# Patient Record
Sex: Male | Born: 1950 | Hispanic: No | Marital: Married | Smoking: Never smoker
Health system: Southern US, Community
[De-identification: ages and names within clinical notes are randomized; demographics above are authoritative.]

## PROBLEM LIST (undated history)

## (undated) DIAGNOSIS — B029 Zoster without complications: Secondary | ICD-10-CM

## (undated) DIAGNOSIS — E785 Hyperlipidemia, unspecified: Secondary | ICD-10-CM

## (undated) DIAGNOSIS — I471 Supraventricular tachycardia, unspecified: Secondary | ICD-10-CM

## (undated) DIAGNOSIS — F419 Anxiety disorder, unspecified: Secondary | ICD-10-CM

## (undated) DIAGNOSIS — I2510 Atherosclerotic heart disease of native coronary artery without angina pectoris: Secondary | ICD-10-CM

## (undated) DIAGNOSIS — I517 Cardiomegaly: Secondary | ICD-10-CM

## (undated) HISTORY — DX: Cardiomegaly: I51.7

## (undated) HISTORY — PX: ROTATOR CUFF REPAIR: SHX139

## (undated) HISTORY — DX: Atherosclerotic heart disease of native coronary artery without angina pectoris: I25.10

## (undated) HISTORY — DX: Hyperlipidemia, unspecified: E78.5

## (undated) HISTORY — DX: Supraventricular tachycardia: I47.1

## (undated) HISTORY — DX: Supraventricular tachycardia, unspecified: I47.10

## (undated) HISTORY — PX: TENDON REPAIR: SHX5111

## (undated) HISTORY — DX: Anxiety disorder, unspecified: F41.9

## (undated) HISTORY — PX: NASAL SEPTUM SURGERY: SHX37

## (undated) HISTORY — DX: Zoster without complications: B02.9

---

## 2000-05-11 ENCOUNTER — Ambulatory Visit (HOSPITAL_BASED_OUTPATIENT_CLINIC_OR_DEPARTMENT_OTHER): Admission: RE | Admit: 2000-05-11 | Discharge: 2000-05-11 | Payer: Self-pay | Admitting: Orthopedic Surgery

## 2000-06-05 ENCOUNTER — Encounter: Admission: RE | Admit: 2000-06-05 | Discharge: 2000-08-08 | Payer: Self-pay | Admitting: Orthopedic Surgery

## 2002-05-07 ENCOUNTER — Encounter: Payer: Self-pay | Admitting: Allergy and Immunology

## 2002-05-07 ENCOUNTER — Encounter: Admission: RE | Admit: 2002-05-07 | Discharge: 2002-05-07 | Payer: Self-pay | Admitting: *Deleted

## 2003-11-15 DIAGNOSIS — B029 Zoster without complications: Secondary | ICD-10-CM

## 2003-11-15 HISTORY — DX: Zoster without complications: B02.9

## 2004-12-03 ENCOUNTER — Encounter: Admission: RE | Admit: 2004-12-03 | Discharge: 2004-12-03 | Payer: Self-pay | Admitting: Internal Medicine

## 2005-02-10 ENCOUNTER — Encounter: Admission: RE | Admit: 2005-02-10 | Discharge: 2005-02-10 | Payer: Self-pay | Admitting: Internal Medicine

## 2005-05-24 ENCOUNTER — Encounter (INDEPENDENT_AMBULATORY_CARE_PROVIDER_SITE_OTHER): Payer: Self-pay | Admitting: *Deleted

## 2006-03-31 ENCOUNTER — Ambulatory Visit (HOSPITAL_BASED_OUTPATIENT_CLINIC_OR_DEPARTMENT_OTHER): Admission: RE | Admit: 2006-03-31 | Discharge: 2006-03-31 | Payer: Self-pay | Admitting: Orthopedic Surgery

## 2008-08-10 ENCOUNTER — Inpatient Hospital Stay (HOSPITAL_COMMUNITY): Admission: EM | Admit: 2008-08-10 | Discharge: 2008-08-12 | Payer: Self-pay | Admitting: Internal Medicine

## 2008-08-10 ENCOUNTER — Ambulatory Visit: Payer: Self-pay | Admitting: Cardiology

## 2008-08-10 ENCOUNTER — Encounter: Payer: Self-pay | Admitting: Emergency Medicine

## 2008-08-11 ENCOUNTER — Encounter (INDEPENDENT_AMBULATORY_CARE_PROVIDER_SITE_OTHER): Payer: Self-pay | Admitting: Internal Medicine

## 2008-09-29 ENCOUNTER — Ambulatory Visit: Payer: Self-pay | Admitting: Internal Medicine

## 2009-01-09 ENCOUNTER — Ambulatory Visit: Payer: Self-pay | Admitting: Internal Medicine

## 2009-03-30 DIAGNOSIS — E785 Hyperlipidemia, unspecified: Secondary | ICD-10-CM

## 2009-03-30 DIAGNOSIS — F411 Generalized anxiety disorder: Secondary | ICD-10-CM | POA: Insufficient documentation

## 2009-03-30 DIAGNOSIS — I2510 Atherosclerotic heart disease of native coronary artery without angina pectoris: Secondary | ICD-10-CM | POA: Insufficient documentation

## 2009-03-30 DIAGNOSIS — I498 Other specified cardiac arrhythmias: Secondary | ICD-10-CM

## 2009-04-01 ENCOUNTER — Ambulatory Visit: Payer: Self-pay | Admitting: Internal Medicine

## 2009-04-03 ENCOUNTER — Ambulatory Visit: Payer: Self-pay | Admitting: Internal Medicine

## 2009-04-20 ENCOUNTER — Ambulatory Visit: Payer: Self-pay | Admitting: Diagnostic Radiology

## 2009-04-20 ENCOUNTER — Emergency Department (HOSPITAL_BASED_OUTPATIENT_CLINIC_OR_DEPARTMENT_OTHER): Admission: EM | Admit: 2009-04-20 | Discharge: 2009-04-20 | Payer: Self-pay | Admitting: Emergency Medicine

## 2009-04-27 ENCOUNTER — Ambulatory Visit: Payer: Self-pay | Admitting: Internal Medicine

## 2009-05-11 ENCOUNTER — Ambulatory Visit: Payer: Self-pay | Admitting: Internal Medicine

## 2009-07-06 ENCOUNTER — Encounter (INDEPENDENT_AMBULATORY_CARE_PROVIDER_SITE_OTHER): Payer: Self-pay | Admitting: *Deleted

## 2009-10-06 ENCOUNTER — Telehealth: Payer: Self-pay | Admitting: Internal Medicine

## 2009-12-01 ENCOUNTER — Ambulatory Visit: Payer: Self-pay | Admitting: Family Medicine
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
## 2010-03-08 ENCOUNTER — Ambulatory Visit: Payer: Self-pay | Admitting: Family Medicine

## 2010-03-08 LAB — CONVERTED CEMR LAB
ALT: 18 units/L (ref 0–53)
Alkaline Phosphatase: 49 units/L (ref 39–117)
Basophils Relative: 0.9 % (ref 0.0–3.0)
Bilirubin, Direct: 0.1 mg/dL (ref 0.0–0.3)
Calcium: 8.8 mg/dL (ref 8.4–10.5)
Chloride: 104 meq/L (ref 96–112)
Creatinine, Ser: 1.1 mg/dL (ref 0.4–1.5)
Eosinophils Relative: 3 % (ref 0.0–5.0)
HDL: 64 mg/dL (ref >39.00)
Ketones, urine, test strip: NEGATIVE
LDL Cholesterol: 90 mg/dL (ref 0–99)
Lymphocytes Relative: 29.1 % (ref 12.0–46.0)
Monocytes Relative: 5.6 % (ref 3.0–12.0)
Neutrophils Relative %: 61.4 % (ref 43.0–77.0)
Nitrite: NEGATIVE
RBC: 4.49 M/uL (ref 4.22–5.81)
Total CHOL/HDL Ratio: 3
Total Protein: 7 g/dL (ref 6.0–8.3)
Triglycerides: 54 mg/dL (ref 0.0–149.0)
Urobilinogen, UA: 0.2
WBC: 5 10*3/uL (ref 4.5–10.5)

## 2010-03-15 ENCOUNTER — Ambulatory Visit: Payer: Self-pay | Admitting: Family Medicine

## 2010-03-15 DIAGNOSIS — M542 Cervicalgia: Secondary | ICD-10-CM

## 2010-05-06 ENCOUNTER — Encounter
Admission: RE | Admit: 2010-05-06 | Discharge: 2010-05-06 | Payer: Self-pay | Admitting: Physical Medicine & Rehabilitation

## 2010-11-02 ENCOUNTER — Ambulatory Visit: Payer: Self-pay | Admitting: Family Medicine

## 2010-12-14 NOTE — Letter (Signed)
Summary: Date Range:01-31-05 to 05-24-05/[facility]  Date Range:01-31-05 to 05-24-05/[facility]   Imported By: Sherian Rein 04/30/2010 10:34:01  _____________________________________________________________________  External Attachment:    Type:   Image     Comment:   External Document

## 2010-12-14 NOTE — Assessment & Plan Note (Signed)
Summary: CPX // RS   Vital Signs:  Patient profile:   60 year old male Height:      65.5 inches Weight:      149 pounds BMI:     24.51 Temp:     97.7 degrees F oral BP sitting:   110 / 76  (left arm) Cuff size:   regular  Vitals Entered By: Kern Reap CMA Duncan Dull) (Mar 15, 2010 8:32 AM) CC: cpx   Primary Care Provider:  Valente David MD  CC:  cpx.  History of Present Illness: [name] is a 60 year old, married male, nonsmoker, who comes in today for evaluation of 4 problems.  He has a history of underlying coronary disease.  He takes Lipitor 20 mg daily asymptomatic.  He also takes a beta-blocker 25 mg daily.  He does take one aspirin tablet daily.  He does take a Valium 10 mg p.r.n. when he travels.  He continues to have cervical pain.  In 2005 he developed shingles and has developed a post-shingles neuralgia.  He's been worked up by his primary care care physician at the time Dr. Lenord Fellers, who subsequently referred him to Dr. love.  No medication.  Would help.  They even tried low-dose Vicodin however, he couldn't tolerate that because of nausea.he does Stepanian is constant, dull, a 6 on a scale of one to 10, and it causes significant sleep dysfunction.  He's never been to the pain clinic.  Will refer him for further evaluation.  He also states his height has decreased by 2 inches in the last two years.........Marland Kitchen will check bone density.  He gets routine eye care.  [id number]  months because of borderline pressures.  He gets routine dental care, colonoscopy, normal in GI, tetanus, 2009, seasonal flu 2010, hepatitis A, 2010.   Allergies: No Known Drug Allergies  Past History:  Past medical, surgical, family and social histories (including risk factors) reviewed, and no changes noted (except as noted below).  Past Medical History: Reviewed history from 03/30/2009 and no changes required. ANXIETY (ICD-300.00) CORONARY ARTERY DISEASE (ICD-414.00) HYPERLIPIDEMIA  (ICD-272.4) SUPRAVENTRICULAR TACHYCARDIA (ICD-427.89)  Past Surgical History: Reviewed history from 03/30/2009 and no changes required. Status post rotator cuff repair.  Status post nasal septoplasty.   Status post left arm tendon repair  Family History: Reviewed history from 03/30/2009 and no changes required.  His father had coronary artery disease in his 42s and   eventually died of a myocardial infarction at the age of 59.  He has two   brothers with heart problems, at least one of whom has coronary artery   disease and has had revascularization.  He also has a sister with heart   disease and thinks that she has bradycardia requiring a pacemaker.   Social History: Reviewed history from 03/30/2009 and no changes required.  The patient is married with no children.  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  He has never smoked, does not drink   alcohol and does not use drugs.  He exercises regularly without   limitation or chest pain.   Review of Systems      See HPI  Physical Exam  General:  Well-developed,well-nourished,in no acute distress; alert,appropriate and cooperative throughout examination Head:  Normocephalic and atraumatic without obvious abnormalities. No apparent alopecia or balding. Eyes:  No corneal or conjunctival inflammation noted. EOMI. Perrla. Funduscopic exam benign, without hemorrhages, exudates or papilledema. Vision grossly normal. Ears:  External ear exam shows no significant  lesions or deformities.  Otoscopic examination reveals clear canals, tympanic membranes are intact bilaterally without bulging, retraction, inflammation or discharge. Hearing is grossly normal bilaterally. Nose:  External nasal examination shows no deformity or inflammation. Nasal mucosa are pink and moist without lesions or exudates. Mouth:  Oral mucosa and oropharynx without lesions or exudates.  Teeth in good repair. Neck:  No deformities, masses, or tenderness noted. Chest  Wall:  No deformities, masses, tenderness or gynecomastia noted. Breasts:  No masses or gynecomastia noted Lungs:  Normal respiratory effort, chest expands symmetrically. Lungs are clear to auscultation, no crackles or wheezes. Heart:  Normal rate and regular rhythm. S1 and S2 normal without gallop, murmur, click, rub or other extra sounds. Abdomen:  Bowel sounds positive,abdomen soft and non-tender without masses, organomegaly or hernias noted. Rectal:  No external abnormalities noted. Normal sphincter tone. No rectal masses or tenderness. Genitalia:  Testes bilaterally descended without nodularity, tenderness or masses. No scrotal masses or lesions. No penis lesions or urethral discharge. Prostate:  Prostate gland firm and smooth, no enlargement, nodularity, tenderness, mass, asymmetry or induration. Msk:  No deformity or scoliosis noted of thoracic or lumbar spine.   Pulses:  R and L carotid,radial,femoral,dorsalis pedis and posterior tibial pulses are full and equal bilaterally Extremities:  No clubbing, cyanosis, edema, or deformity noted with normal full range of motion of all joints.   Neurologic:  No cranial nerve deficits noted. Station and gait are normal. Plantar reflexes are down-going bilaterally. DTRs are symmetrical throughout. Sensory, motor and coordinative functions appear intact. Skin:  Intact without suspicious lesions or rashes Cervical Nodes:  No lymphadenopathy noted Axillary Nodes:  No palpable lymphadenopathy Inguinal Nodes:  No significant adenopathy Psych:  Cognition and judgment appear intact. Alert and cooperative with normal attention span and concentration. No apparent delusions, illusions, hallucinations   Impression & Recommendations: Assessment Improved  His updated medication list for this problem includes:    Diazepam 10 Mg Tabs (Diazepam) .Marland Kitchen... Take one tab at bedtime as needed  Problem # 2:  HYPERLIPIDEMIA (ICD-272.4) Assessment: Improved  His updated  medication list for this problem includes:    Lipitor 20 Mg Tabs (Atorvastatin calcium) .Marland Kitchen... Take one tablet by mouth daily.  Orders: Prescription Created Electronically [phone]) EKG w/ Interpretation (93000)  Problem # 3:  Preventive Health Care (ICD-V70.0) Assessment: Unchanged  Problem # 4:  NECK PAIN, CHRONIC (ICD-723.1) Assessment: New  His updated medication list for this problem includes:    Aspirin Ec 325 Mg Tbec (Aspirin) .Marland Kitchen... Take one tablet by mouth daily  Orders: Pain Clinic Referral (Pain)  Complete Medication List: 1)  Lipitor 20 Mg Tabs (Atorvastatin calcium) .... Take one tablet by mouth daily. 2)  Metoprolol Succinate 25 Mg Xr24h-tab (Metoprolol succinate) .... Take one tablet by mouth daily 3)  Aspirin Ec 325 Mg Tbec (Aspirin) .... Take one tablet by mouth daily 4)  Diazepam 10 Mg Tabs (Diazepam) .... Take one tab at bedtime as needed  Other Orders: T-Bone Densitometry [phone])  Patient Instructions: 1)  we will set you up a consult in the pain clinic for further evaluation. 2)  Continue current medications follow-up in one year. 3)  We will also get to set up for a bone density.  Because of the change in your height 4)  It is important that you exercise regularly at least 20 minutes 5 times a week. If you develop chest pain, have severe difficulty breathing, or feel very tired , stop exercising immediately and seek medical attention.  5)  Schedule a colonoscopy/sigmoidoscopy to help detect colon cancer. 6)  Take an Aspirin every day. Prescriptions: DIAZEPAM 10 MG TABS (DIAZEPAM) take one tab at bedtime as needed  #50 x 1   Entered and Authorized by:   Roderick Pee MD   Signed by:   Roderick Pee MD on 03/15/2010   Method used:   Print then Give to Patient   RxID:   [id number] METOPROLOL SUCCINATE 25 MG XR24H-TAB (METOPROLOL SUCCINATE) Take one tablet by mouth daily  #100 x 3   Entered and Authorized by:   Roderick Pee MD   Signed by:   Roderick Pee MD on 03/15/2010   Method used:   Electronically to        [facility]  [phone]* (retail)       [address]       [place], [place]  [address]       Ph: [phone] or [phone]       Fax: [phone]   RxID:   [id number] LIPITOR 20 MG TABS (ATORVASTATIN CALCIUM) Take one tablet by mouth daily.  #100 x 3   Entered and Authorized by:   Roderick Pee MD   Signed by:   Roderick Pee MD on 03/15/2010   Method used:   Electronically to        [facility]  [phone]* (retail)       [address]       [place], [place]  [address]       Ph: [phone] or [phone]       Fax: [phone]   RxID:   [id number]    Immunization History:  Hepatitis A Immunization History:    Hepatitis A # 1:  historical (08/14/2009)

## 2010-12-14 NOTE — Assessment & Plan Note (Signed)
Summary: to be est/njr   Vital Signs:  Patient profile:   60 year old male Height:      67 inches Weight:      150 pounds Temp:     98.3 degrees F oral BP sitting:   120 / 84  (left arm) Cuff size:   regular  Vitals Entered By: Kern Reap CMA Duncan Dull) (December 01, 2009 1:55 PM)  Primary Care Provider:  Valente David MD   History of Present Illness: [name] is a delightful, 60 year old, married male, nonsmoker, who comes in today as a new patient to get established.  He states he was hospitalized in September of 09  for SVT.  cardiac evaluation at that time was negative, except for a 40% blockage, but he does not recall which artery was involved.  He was told by Dr. Johney Frame.  It was not significant.  He exercises without chest pain, shortness of breath, etc..  He is on metoprolol 25 mg daily and has no recurrence of the arrhythmia.  Last physical exam, April 2010.  Review of systems pertinent in this surgery.  Ligament in his left elbow, right shoulder, about of zoster back in 04, which left him with some post shingles neuralgia in his neck.  His family history is pertinent in that his father died at age 87 from myocardial infarction.  Two brothers one died of an MI.  Mother has had bypass surgery two sisters one has a pacemaker in one has hypertension.  Tetanus 2009 seasonal flu 2010  Allergies (verified): No Known Drug Allergies  Past History:  Past medical, surgical, family and social histories (including risk factors) reviewed, and no changes noted (except as noted below).  Past Medical History: Reviewed history from 03/30/2009 and no changes required. ANXIETY (ICD-300.00) CORONARY ARTERY DISEASE (ICD-414.00) HYPERLIPIDEMIA (ICD-272.4) SUPRAVENTRICULAR TACHYCARDIA (ICD-427.89)  Past Surgical History: Reviewed history from 03/30/2009 and no changes required. Status post rotator cuff repair.  Status post nasal septoplasty.   Status post left arm tendon  repair  Family History: Reviewed history from 03/30/2009 and no changes required.  His father had coronary artery disease in his 58s and   eventually died of a myocardial infarction at the age of 105.  He has two   brothers with heart problems, at least one of whom has coronary artery   disease and has had revascularization.  He also has a sister with heart   disease and thinks that she has bradycardia requiring a pacemaker.   Social History: Reviewed history from 03/30/2009 and no changes required.  The patient is married with no children.  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  He has never smoked, does not drink   alcohol and does not use drugs.  He exercises regularly without   limitation or chest pain.   Review of Systems      See HPI  Physical Exam  General:  Well-developed,well-nourished,in no acute distress; alert,appropriate and cooperative throughout examination   Impression & Recommendations:  Problem # 1:  HYPERLIPIDEMIA (ICD-272.4) Assessment Unchanged  His updated medication list for this problem includes:    Lipitor 20 Mg Tabs (Atorvastatin calcium) .Marland Kitchen... Take one tablet by mouth daily.  Orders: Cardio-Pulmonary Stress Test Referral (Cardio-Pulmon) Prescription Created Electronically [phone])  Problem # 2:  SUPRAVENTRICULAR TACHYCARDIA (ICD-427.89) Assessment: Improved  His updated medication list for this problem includes:    Metoprolol Succinate 25 Mg Xr24h-tab (Metoprolol succinate) .Marland Kitchen... Take one tablet by mouth daily  Aspirin Ec 325 Mg Tbec (Aspirin) .Marland Kitchen... Take one tablet by mouth daily  Orders: Cardio-Pulmonary Stress Test Referral (Cardio-Pulmon) Prescription Created Electronically [phone])  Problem # 3:  ANXIETY (ICD-300.00) Assessment: Unchanged  His updated medication list for this problem includes:    Diazepam 10 Mg Tabs (Diazepam) .Marland Kitchen... Take one tab at bedtime as needed  Orders: Prescription Created Electronically  [phone])  Problem # 4:  CORONARY ARTERY DISEASE (ICD-414.00) Assessment: Unchanged  His updated medication list for this problem includes:    Metoprolol Succinate 25 Mg Xr24h-tab (Metoprolol succinate) .Marland Kitchen... Take one tablet by mouth daily    Aspirin Ec 325 Mg Tbec (Aspirin) .Marland Kitchen... Take one tablet by mouth daily  Orders: Cardio-Pulmonary Stress Test Referral (Cardio-Pulmon) Prescription Created Electronically [id number])  Complete Medication List: 1)  Lipitor 20 Mg Tabs (Atorvastatin calcium) .... Take one tablet by mouth daily. 2)  Metoprolol Succinate 25 Mg Xr24h-tab (Metoprolol succinate) .... Take one tablet by mouth daily 3)  Aspirin Ec 325 Mg Tbec (Aspirin) .... Take one tablet by mouth daily 4)  Diazepam 10 Mg Tabs (Diazepam) .... Take one tab at bedtime as needed  Patient Instructions: 1)  continue your current medications. 2)  Excess of your personal and family history I would recommend a stress test yearly. 3)  Return in April 2011 for a complete physical exam. 4)  cbc, lipid panel, u/a ,tsh level, liver fcn panel,bmet, and psa prior to next visit (v70.0)  Prescriptions: METOPROLOL SUCCINATE 25 MG XR24H-TAB (METOPROLOL SUCCINATE) Take one tablet by mouth daily  #100 x 3   Entered and Authorized by:   Roderick Pee MD   Signed by:   Roderick Pee MD on 12/01/2009   Method used:   Electronically to        [facility]  [phone]* (retail)       [address]       [place], [place]  [address]       Ph: [phone] or [phone]       Fax: [phone]   RxID:   [id number] LIPITOR 20 MG TABS (ATORVASTATIN CALCIUM) Take one tablet by mouth daily.  #100 x 3   Entered and Authorized by:   Roderick Pee MD   Signed by:   Roderick Pee MD on 12/01/2009   Method used:   Electronically to        [facility]  [phone]* (retail)       [address]       [place], [place]  [address]       Ph: [phone]  or [phone]       Fax: [phone]   RxID:   [id number]    Immunization History:  Tetanus/Td Immunization History:    Tetanus/Td:  historical (11/15/2007)  Influenza Immunization History:    Influenza:  historical (08/14/2009)

## 2011-03-11 ENCOUNTER — Encounter: Payer: Self-pay | Admitting: Family Medicine

## 2011-03-14 ENCOUNTER — Encounter: Payer: Self-pay | Admitting: Family Medicine

## 2011-03-14 ENCOUNTER — Ambulatory Visit (INDEPENDENT_AMBULATORY_CARE_PROVIDER_SITE_OTHER): Payer: Managed Care, Other (non HMO) | Admitting: Family Medicine

## 2011-03-14 DIAGNOSIS — N400 Enlarged prostate without lower urinary tract symptoms: Secondary | ICD-10-CM | POA: Insufficient documentation

## 2011-03-14 DIAGNOSIS — I10 Essential (primary) hypertension: Secondary | ICD-10-CM

## 2011-03-14 DIAGNOSIS — Z2911 Encounter for prophylactic immunotherapy for respiratory syncytial virus (RSV): Secondary | ICD-10-CM

## 2011-03-14 DIAGNOSIS — R972 Elevated prostate specific antigen [PSA]: Secondary | ICD-10-CM

## 2011-03-14 DIAGNOSIS — F411 Generalized anxiety disorder: Secondary | ICD-10-CM

## 2011-03-14 DIAGNOSIS — F419 Anxiety disorder, unspecified: Secondary | ICD-10-CM

## 2011-03-14 DIAGNOSIS — Z23 Encounter for immunization: Secondary | ICD-10-CM

## 2011-03-14 DIAGNOSIS — E785 Hyperlipidemia, unspecified: Secondary | ICD-10-CM

## 2011-03-14 LAB — LIPID PANEL
Cholesterol: 138 mg/dL (ref 0–200)
LDL Cholesterol: 72 mg/dL (ref 0–99)
Triglycerides: 32 mg/dL (ref 0.0–149.0)

## 2011-03-14 LAB — CBC WITH DIFFERENTIAL/PLATELET
Eosinophils Relative: 1.7 % (ref 0.0–5.0)
HCT: 42.1 % (ref 39.0–52.0)
Hemoglobin: 14.4 g/dL (ref 13.0–17.0)
Lymphs Abs: 1.6 10*3/uL (ref 0.7–4.0)
MCV: 93.1 fl (ref 78.0–100.0)
Monocytes Absolute: 0.4 10*3/uL (ref 0.1–1.0)
Neutro Abs: 5 10*3/uL (ref 1.4–7.7)
Platelets: 153 10*3/uL (ref 150.0–400.0)
RDW: 14 % (ref 11.5–14.6)
WBC: 7.2 10*3/uL (ref 4.5–10.5)

## 2011-03-14 LAB — TSH: TSH: 1.23 u[IU]/mL (ref 0.35–5.50)

## 2011-03-14 LAB — BASIC METABOLIC PANEL
BUN: 30 mg/dL — ABNORMAL HIGH (ref 6–23)
Chloride: 103 mEq/L (ref 96–112)
Glucose, Bld: 81 mg/dL (ref 70–99)
Potassium: 4.7 mEq/L (ref 3.5–5.1)

## 2011-03-14 LAB — POCT URINALYSIS DIPSTICK
Blood, UA: NEGATIVE
Glucose, UA: NEGATIVE
Spec Grav, UA: 1.025

## 2011-03-14 LAB — HEPATIC FUNCTION PANEL
ALT: 23 U/L (ref 0–53)
Albumin: 4.2 g/dL (ref 3.5–5.2)
Total Bilirubin: 1 mg/dL (ref 0.3–1.2)
Total Protein: 6.7 g/dL (ref 6.0–8.3)

## 2011-03-14 LAB — PSA: PSA: 2.67 ng/mL (ref 0.10–4.00)

## 2011-03-14 MED ORDER — ATORVASTATIN CALCIUM 20 MG PO TABS: 20.0000 mg | ORAL_TABLET | Freq: Every day | ORAL | Status: DC

## 2011-03-14 MED ORDER — DIAZEPAM 10 MG PO TABS: 10.0000 mg | ORAL_TABLET | Freq: Every day | ORAL | Status: DC

## 2011-03-14 MED ORDER — METOPROLOL SUCCINATE ER 25 MG PO TB24: 25.0000 mg | ORAL_TABLET | Freq: Every day | ORAL | Status: DC

## 2011-03-14 NOTE — Patient Instructions (Signed)
I will call you and I gave her lab work back

## 2011-03-14 NOTE — Progress Notes (Deleted)
  Subjective:    Patient ID: [name], male    DOB: 21-Oct-1951, 60 y.o.   MRN: [id number]  HPI    Review of Systems     Objective:   Physical Exam        Assessment & Plan:

## 2011-03-14 NOTE — Progress Notes (Signed)
  Subjective:    Patient ID: [name], male    DOB: [birth year], 60 y.o.   MRN: [id number]  HPIAqeel Is a 60 year old male, who comes in today for evaluation of underlying hypertension, hyperlipidemia, and elevated PSA.  History of coronary disease, and a heart murmur.  His hypertension is treated with Cardizem 10 mg daily, beta-blocker, 25 daily, BP 120/80, pulse 70 and regular.  Hyperlipidemia.  She was Lipitor 20 mg nightly will check lipid panel today.  Cardiac wise.  He is asymptomatic.  He takes an aspirin tablet daily, and exercises on a regular basis.  His last cardiac evaluation was May 2010 by Dr. Johney Frame.  He is to go back for follow-up.  He also has a history of elevated PSA is followed by Dr. Vonzell Schlatter. In urology.  He gets routine eye care, dental care, colonoscopy, normal 2003, tetanus, 2009, shingles.  Vaccine today    Review of Systems  Constitutional: Negative.   HENT: Negative.   Eyes: Negative.   Respiratory: Negative.   Cardiovascular: Negative.   Gastrointestinal: Negative.   Genitourinary: Negative.   Musculoskeletal: Negative.   Skin: Negative.   Neurological: Negative.   Hematological: Negative.   Psychiatric/Behavioral: Negative.        Objective:   Physical Exam  Constitutional: He is oriented to person, place, and time. He appears well-developed and well-nourished.  HENT:  Head: Normocephalic and atraumatic.  Right Ear: External ear normal.  Left Ear: External ear normal.  Nose: Nose normal.  Mouth/Throat: Oropharynx is clear and moist.  Eyes: Conjunctivae and EOM are normal. Pupils are equal, round, and reactive to light.  Neck: Normal range of motion. Neck supple. No JVD present. No tracheal deviation present. No thyromegaly present.  Cardiovascular: Normal rate, regular rhythm and intact distal pulses.  Exam reveals no gallop and no friction rub.   Murmur heard.      Grade 2/6 blowing systolic murmur heard best at the apex  Pulmonary/Chest:  Effort normal and breath sounds normal. No stridor. No respiratory distress. He has no wheezes. He has no rales. He exhibits no tenderness.  Abdominal: Soft. Bowel sounds are normal. He exhibits no distension and no mass. There is no tenderness. There is no rebound and no guarding.  Genitourinary: Rectum normal, prostate normal and penis normal. Guaiac negative stool. No penile tenderness.  Musculoskeletal: Normal range of motion. He exhibits no edema and no tenderness.  Lymphadenopathy:    He has no cervical adenopathy.  Neurological: He is alert and oriented to person, place, and time. He has normal reflexes. No cranial nerve deficit. He exhibits normal muscle tone.  Skin: Skin is warm and dry. No rash noted. No erythema. No pallor.  Psychiatric: He has a normal mood and affect. His behavior is normal. Judgment and thought content normal.          Assessment & Plan:T.      the beta-blocker also covers for the SVHyperlipidemia.  Continue Lipitor 20 daily and aspirin.  Elevated PSA.  Recheck level.  Coronary disease.  Follow-up with Dr. Johney Frame

## 2011-03-16 NOTE — Progress Notes (Signed)
Left message on machine for patient

## 2011-03-28 ENCOUNTER — Encounter: Payer: Self-pay | Admitting: Internal Medicine

## 2011-03-29 NOTE — Discharge Summary (Signed)
Samuel, Lutz NO.:  [id number]   MEDICAL RECORD NO.:  [id number]          PATIENT TYPE:  INP   LOCATION:  2041                         FACILITY:  [facility]   PHYSICIAN:  Hillis Range, MD       DATE OF BIRTH:  1951/07/13   DATE OF ADMISSION:  08/10/2008  DATE OF DISCHARGE:  08/12/2008                               DISCHARGE SUMMARY   This patient has no known drug allergies.   The time of this dictation and exam greater than 35 minutes.   FINAL DIAGNOSES:  1. Admitted with palpitation/diaphoresis.      a.     Supraventricular tachycardia, on strips in the emergency       room.      b.     Possible atrial tachycardia which broke with IV Cardizem.      c.     Now, the patient is discharged on metoprolol 25 mg b.i.d.  2. Discharging day #1, status post left heart catheterization.  The      study shows angiographically normal coronary arteries except for a      40% proximal midpoint stenosis in the LAD.  Ejection fraction at      cath was 55%.  3. Elevated troponin I studies.  The zenith is 4.28, probable demand      ischemia.  4. Echocardiogram on August 11, 2008, left ventricular function is      60-65%.  No mitral regurgitation.  No tricuspid regurgitation.   PLAN:  Continue beta-blocker/aspirin/Plavix/Lipitor.   SECONDARY DIAGNOSES:  1. Dyslipidemia.  2. History of shoulder surgery.  No previous history of coronary      artery disease or cerebrovascular disease.   PROCEDURES DURING THIS ADMISSION:  1. Left heart catheterization on August 11, 2008.  The study shows      only one area of focal stenosis in the prox to mid  LAD, otherwise      coronaries are angiographically normal, ejection fraction at 55%.  2. A 2-D echocardiogram on August 11, 2008.  Ejection fraction of      60-65%.  No mitral regurgitation.  No tricuspid regurgitation.  No      left ventricular wall motion abnormalities.   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  He was exercising  and then after exercise he started with palpitation and diaphoresis.  The patient did not have nausea or chest pain with this.  He has never  had such an episode before in his life.   The patient exercises regularly.  He has never experienced any chest  pain or palpitation before August 10, 2008.  He came to the emergency  room and was found to be in a narrow complex tachycardia consistent with  SVT, probably an atrial tachycardia.  This rhythm broke after the  patient was given IV Cardizem.  He was admitted for further workup.  His  enzymes will be cycled and he will have Cardiology in consultation.   HOSPITAL COURSE:  The patient was admitted through the emergency room on  August 10, 2008, with palpitation and diaphoresis.  He was found to  have a rapid narrow complex tachycardia consistent possibly with atrial  tachycardia.  This rhythm resolved after IV Cardizem was administered.  The patient was then started on a beta-blocker, metoprolol 25 mg twice  daily.  He has had no recurrence during this hospitalization.  He did  have elevated troponin I studies with a zenith troponin of 4.28.  For  this reason, he went for a left heart catheterization on August 11, 2008.  The study as mentioned above showed angiographically normal  coronaries except for a 40% stenosis at the mid LAD.  Ejection fraction  is preserved at 55%.  Echocardiogram also parallels these results with  an ejection fraction of 60-65% and all valves working well.  The patient  was seen by Dr. Johney Frame in consultation for electrophysiology on  August 12, 2008.  He agreed with continuing aspirin, Plavix, and  metoprolol 25 mg b.i.d. and recommended the patient for a followup  office visit in 4-6 weeks.  The patient will be discharged on the  following medications:  1. A new medication, enteric-coated aspirin 325 mg daily.  2. New medication, Plavix 75 mg daily, a prescription has  been given.  3. New medication, metoprolol tartrate 25 mg twice daily, a      prescription has been given.  4. Lipitor 20 mg daily each night.  He has been taking this at home.   He follows up at [facility], [address]  [place], [place]; to see Dr. Johney Frame on Monday, September 15, 2008, at 2 o'clock.   LABORATORY STUDIES DURING THIS ADMISSION:  Troponin I studies at 1.74,  then 4.28, then 2.47.  Complete Blood Count:  White cells 10.4,  hemoglobin 14.4, hematocrit 42.9, and platelets of 163.  Lipid Panel:  Cholesterol 126, LDL cholesterol 72, HDL cholesterol 43, and  triglycerides 55.  Serum Electrolytes:  Sodium is 140, potassium 3.9,  chloride 106, carbonate 27, BUN is 15, creatinine 1.12, and glucose is  94.  Pro time this admission 14.1 and INR 1.1.  Alkaline phosphatase is  46, SGOT 56, and SGPT is 21.      [name], [name]      Hillis Range, MD  Electronically Signed    GM/MEDQ  D:  08/12/2008  T:  08/13/2008  Job:  [id number]

## 2011-03-29 NOTE — H&P (Signed)
[name], [name]                 ACCOUNT NO.:  [id number]   MEDICAL RECORD NO.:  [id number]          PATIENT TYPE:  INP   LOCATION:  2041                         FACILITY:  [facility]   PHYSICIAN:  Wilson Singer, M.D.DATE OF BIRTH:  August 23, 1951   DATE OF ADMISSION:  08/10/2008  DATE OF DISCHARGE:                              HISTORY & PHYSICAL   HISTORY:  This is a very pleasant 60 year old [place] man who after  exercise today started to notice palpitations which were rapid and  regular, and he had experienced left arm pain as well as sweating with  this.  There was no nausea or dyspnea or chest pain with this.  He has  never had such an episode of his life before.  He exercises regularly  and has never experienced any chest pain or any palpitations before with  exertion except for today.  He came to the emergency room and was found  to have a, by description of the ER physician, a supraventricular  tachycardia which was treated with intravenous Cardizem drip and has  promptly gone back into a sinus rhythm with occasional episodes of short  runs of SVT and possibly V-tach, but this is not entirely clear.  He  continued on a Cardizem drip, and we are asked to further evaluate him  now.   PAST MEDICAL HISTORY:  Hyperlipidemia on statin.  Shoulder surgery in  the past.  No other serious illnesses or operations.  No history of  coronary artery disease and no history of cerebrovascular disease.   MEDICATIONS:  Lipitor 20 mg daily.   ALLERGIES:  None.   SOCIAL HISTORY:  He has been married for about 30 years.  He does not  smoke and does not drink alcohol.  He is a Chiropodist working in Conservation officer, historic buildings.   FAMILY HISTORY:  He appears to have a strong family history of coronary  artery disease, although on close questioning it is not clear whether  this is early coronary artery disease but certainly his father,  grandfather, and brothers and also sisters apparently do have  heart  disease.   PHYSICAL EXAMINATION:  GENERAL:  The patient is afebrile and appears to  be hemodynamically stable.  VITAL SIGNS:  Temperature 97.8, blood pressure 125/80, pulse 80 and in  sinus rhythm, respiratory rate 12, saturation 100% on room air.  HEART:  Heart sounds are present and normal.  LUNGS:  Lung fields are clear.  ABDOMEN:  Soft and nontender.  NEUROLOGIC:  He is alert and oriented with no focal neurological signs.   INVESTIGATIONS:  Sodium 141, potassium 4.5, bicarbonate 23, glucose 137,  BUN 21, creatinine 1.5.  Hemoglobin 15.82, white blood cell count 20.0,  platelets 190.  Chest x-ray shows a very vague, patchy opacity in the  right lung base, but these findings are nonspecific.  Initial troponin  is less than 0.05.  His electrocardiogram shows a partial right bundle  branch block pattern and no ST-T wave changes.   IMPRESSION:  1. Supraventricular tachycardia of unclear etiology.  2. Family history of coronary artery  disease.   PLAN:  1. Admit.  2. Serial cardiac enzymes and ECG.  3. Echocardiogram.  4. Cardiology consultation.   Further recommendations will depend on the patient's hospital progress.      Wilson Singer, M.D.  Electronically Signed     NCG/MEDQ  D:  08/10/2008  T:  08/11/2008  Job:  [id number]   cc:   Luanna Cole. Lenord Fellers, M.D.

## 2011-03-29 NOTE — Cardiovascular Report (Signed)
NAMEROSALIO, CATTERTON                 ACCOUNT NO.:  [id number]   MEDICAL RECORD NO.:  [id number]          PATIENT TYPE:  INP   LOCATION:  2041                         FACILITY:  [facility]   PHYSICIAN:  Bruce R. 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  He has no prior  history of known heart disease.  He was admitted last night with rapid  palpitations and left arm pain.  He had documented ventricular  tachycardia rate about 160 which lasted about 2 hours.  He had left arm  pain.  His troponins returned positive with a peak troponin of 4.  His  12-lead ECG after conversion showed slight anterior T-wave changes.   PROCEDURE:  The procedure was performed by femoral arterial sheath and 5-  [place] preformed coronary catheters.  A front wall arterial puncture was  performed, and Omnipaque contrast was used.  The patient tolerated the  procedure well and left laboratory in satisfactory condition.   RESULTS:  Left main coronary artery:  The left main coronary artery was  free of significant disease.   Left anterior descending artery:  The left anterior descending artery  gave rise to 3 diagonal branches and 2 septal perforators.  There was  40% narrowing in the LAD after the first 2 diagonal branches.   Circumflex artery:  The circumflex artery gave rise to a ramus branch, a  marginal branch, and posterolateral branch.  These vessels were free of  significant disease.   Right coronary artery:  The right coronary artery was a moderate-sized  vessel that gave rise to a conus branch, a posterior descending branch,  and posterolateral branch.  These vessels were free of significant  disease.   Left ventriculogram:  The left ventriculogram performed in the RAO  projection showed good wall motion with no areas of hypokinesis.  Estimated ejection fraction was 55%.   The aortic pressure was 94/60 and the left ventricular pressure was  94/15.   CONCLUSION:  Nonobstructive coronary artery disease with 40% narrowing  in the mid LAD, no significant obstruction of circumflex and right  coronary arteries and normal LV function.   RECOMMENDATIONS:  The patient appears to have had demand myocardial  infarction, although his troponins were unusually high at greater than 4  and he does have some slight anterior T-wave changes.  The lesion in the  mid  LAD does not appear to be obstructive, although it possibly could be  ruptured plaque that could have accounted for his presentation.  I think  medical therapy is indicated and I recommend treatment with aspirin and  Plavix and initial beta-blocker.  If he has recurrence of his SVT, then  he maybe a candidate for radiofrequency ablation.      [name], MD, [facility]  Electronically Signed     BRB/MEDQ  D:  08/11/2008  T:  08/12/2008  Job:  [id number]   cc:   Luanna Cole. Lenord Fellers, M.D.  Cardiopulmonary Lab  Hillis Range, MD

## 2011-03-29 NOTE — Assessment & Plan Note (Signed)
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  After a  recent hospitalization, August 10, 2008, for supraventricular  tachycardia.   PROBLEM LIST:  1. Supraventricular tachycardia.  2. Hyperlipidemia.  3. Nonobstructive coronary artery disease.  4. Status post remote shoulder surgery.   PROCEDURES:  1. Echocardiogram, August 11, 2008, revealed a left ventricular      ejection fraction of 60-65%.  No mitral regurgitation.  No      tricuspid regurgitation.  2. Left heart catheterization, August 11, 2008 revealed      angiographically normal coronary arteries except for 40% proximal      midpoint stenosis in the LAD.  Ejection fraction was 55%.   MEDICATIONS:  1. Aspirin 325 mg daily.  2. Metoprolol 50 mg twice daily.  3. Plavix 75 mg daily.  4. Lipitor 20 mg daily.   ALLERGIES:  No known drug allergies.   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  He  was recently evaluated by me, during his hospitalization in September of  this year.  At that time, he had been riding his bike, when he developed  acute palpitations and heart racing with associated left arm pain and  diaphoresis.  Upon presentation to the emergency department, the patient  was found to have a narrow complex supraventricular tachycardia, which  converted to sinus rhythm with a Cardizem drip.  He subsequently was  noted to have a wide complex tachycardia, which was felt to be either  aberrant supraventricular tachycardia or possibly ventricular  tachycardia.  He subsequently did well with no  further arrhythmias.   Since being discharged from the hospital, the patient reports doing very  well.  He denies further palpitations, chest discomfort, shortness of  breath, orthopnea, PND, presyncope, or syncope.  He is otherwise without  complaint at this time.  He reports mild fatigue initially upon starting  metoprolol.  However, he feels that this has resolved.   SOCIAL HISTORY:  The patient works as Administrator, arts of a Chartered loss adjuster.  He denies  tobacco, alcohol, or drug use.  He has been married for 30 years.   FAMILY HISTORY:  He has a family history of coronary artery disease, but  no arrhythmias.   PHYSICAL EXAMINATION:  VITALS:  Blood pressure 124/72, heart rate 92,  respirations 18, and weight 169 pounds.  GENERAL:  The patient is a well-appearing male in no acute distress.  He  is alert and oriented x3.  HEENT:  Normocephalic, atraumatic.  Sclerae clear.  Conjunctivae pink.  Oropharynx clear.  NECK:  Supple.  No JVD, lymphadenopathy, or bruits.  LUNGS:  Clear to auscultation bilaterally.  HEART:  Regular rate and rhythm, 2/6  systolic ejection murmur along the  left sternal border.  GI:  Soft, nontender, and nondistended.  Positive bowel sounds.  EXTREMITIES:  No clubbing, cyanosis, or edema.  NEUROLOGIC:  Strength and sensation are intact.   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  He has had no  further symptomatic supraventricular tachycardia since initiation of  metoprolol.  He is otherwise doing well without symptoms of ischemia.   PLAN:  1. Plavix is discontinued today.  2. No other medication changes are made today.  3. Therapeutic strategies for supraventricular tachycardia, including      both medicine and catheter-based therapies were again  discussed      with the patient today in the clinic.  He presently would like to      continue medical therapy for SVT and may consider catheter ablation      in the future, should he have worsening symptomatic episodes.  4. Return to the clinic in 6 months for routine followup.     Hillis Range, MD  Electronically Signed    JA/MedQ  DD: 09/29/2008  DT: 09/30/2008  Job #: [id number]

## 2011-03-29 NOTE — Consult Note (Signed)
[name], [name] NO.:  [id number]   MEDICAL RECORD NO.:  [id number]          PATIENT TYPE:  INP   LOCATION:  2041                         FACILITY:  [facility]   PHYSICIAN:  Adela Ports, MD   DATE OF BIRTH:  March 01, 1951   DATE OF CONSULTATION:  08/11/2008  DATE OF DISCHARGE:                                 CONSULTATION   CHIEF COMPLAINT:  Palpitations and chest pain and positive troponin.   HISTORY OF PRESENT ILLNESS:  [name] is a 60 year old [place] Asian man  with a family history of coronary artery disease and personal history of  hyperlipidemia treated with a statin.  He is active and exercises  regularly and has never had problems with chest pain, palpitations or  any other cardiac diagnosis other than hyperlipidemia in the past.   On the afternoon of August 10, 2008, about 1:30 p.m., the patient  developed the sudden onset of tachypalpitations that were regular and  persistent.  He could not recall any specific event that triggered them,  though he says that he had exercised in the morning, running on a  treadmill, riding a bike and lifting weights and maybe pushed himself a  little harder than normal.  At any rate, the tachypalpitations started  about 1:30 p.m. and continued, and within about half an hour or an hour,  he began to have some aching in his left shoulder and arm and some  tightness in his chest.  He had a few episodes of diaphoresis but no  shortness of breath, nausea or other symptoms.  He chewed an aspirin and  went to the emergency department for further evaluation.  In the  emergency department, he was noted to be in a narrow complex tachycardia  with a rate of approximately 170.  This broke spontaneously, and his  symptoms resolved.  He was subsequently given an IV bolus of Diltiazem  and started on a Diltiazem drip.  He was then transferred to the floor  at [facility] for further evaluation.  While on the floor, his second  set  of cardiac enzymes came back positive, and a cardiology consult was  called.   PAST MEDICAL HISTORY:  1. No prior cardiac catheterization and no prior cardiac surgery.  2. Hyperlipidemia.  3. Anxiety.  4. Status post rotator cuff repair.  5. Status post nasal septoplasty.  6. Status post left arm tendon repair.   SOCIAL HISTORY:  The patient is married with no children.  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  He has never smoked, does not drink  alcohol and does not use drugs.  He exercises regularly without  limitation or chest pain.   FAMILY HISTORY:  His father had coronary artery disease in his 84s and  eventually died of a myocardial infarction at the age of 46.  He has two  brothers with heart problems, at least one of whom has coronary artery  disease and has had revascularization.  He also has a sister with heart  disease and thinks that she has bradycardia requiring a  pacemaker.   REVIEW OF SYSTEMS:  A 9-system review of systems was obtained and  reviewed and otherwise negative in detail.   CODE STATUS:  Full.   ALLERGIES:  NO KNOWN DRUG ALLERGIES.   MEDICATIONS:  1. Lipitor 20 mg p.o. daily.  2. Diazepam as needed.   PHYSICAL EXAMINATION:  VITAL SIGNS:  Temperature 97.4, pulse 78, but  down to 44 while sleeping on a Diltiazem drip, respiratory rate 14,  blood pressure 113/74, weight 73 kg, oxygen saturation 98% on 2 liters.  GENERAL:  He is a pleasant Asian man in no apparent distress, alert and  oriented x3.  HEENT:  Extraocular movements intact.  Pupils equal, round and reactive.  Mucous membranes moist.  NECK:  Supple without lymphadenopathy or thyromegaly.  Carotids are 2+  without bruits.  There is no jugular venous distention.  CARDIOVASCULAR:  Regular rate and rhythm with normal S1 and S2.  There  is a high-pitched 2/6 systolic murmur at the apex with a slightly  squeaky quality, but most likely consistent with mitral regurgitation.  Pulses are 2+  and equal without bruits bilaterally in the femoral, DP  and PT areas.  LUNGS:  Clear to auscultation bilaterally.  SKIN:  No rash or lesions.  ABDOMEN:  Soft, nontender, nondistended with normoactive bowel sounds.  No rebound or guarding, no hepatosplenomegaly.  EXTREMITIES:  No clubbing, cyanosis or edema.  MUSCULOSKELETAL:  Normal.  NEUROLOGIC:  Normal   RADIOLOGY:  Chest x-ray shows possible patchy opacity in the right lung  base.  EKG - rate is 78 in sinus rhythm with an incomplete right bundle  branch block, QRS of 110, QTC of 450.  There are no Q-waves.  There are  nonspecific anterior ST-T wave changes with no prior EKG to compare to.  The rhythm strip from the emergency department demonstrates a narrow  complex tachycardia at 168 beats per minute.  It appears that there are  P-waves before and after each QRS, and this most likely represents an  atrial tachycardia or atrial flutter with 2:1 conduction.   LABORATORY WORK:  On admission, white blood count was 20 and now down to  13, hematocrit stable at 43, platelets 174.  BUN was 21 and now 18,  creatinine was 1.5 on admission, now 1.0.  TSH normal at 0.715.  Troponin went from less than assay to 1.74.  MB fraction went from 7.3  up to 26.4, and CK is 417.  Myoglobin on admission was 431.   ASSESSMENT/PLAN:  [name] is a 60 year old [place] male with a  family history of coronary disease and a personal history of  hyperlipidemia.  He exercises regularly without chest pain or anginal  equivalent.  He now presents with sudden onset of a narrow complex  tachycardia and non-ST elevation myocardial infarction.  1. Supraventricular tachycardia.  This is most likely atrial      tachycardia or atrial flutter, though the P-waves on the rhythm      strip are not completely obvious.  Currently, the patient is in      sinus rhythm and is actually bradycardic with occasional junctional      beats while on a Diltiazem drip.  At this  time, I would recommend      stopping his Diltiazem drip.  Will add a low-dose beta blocker,      given his non-ST segment myocardial infarction.  If he does have      recurrence of his supraventricular tachycardia,  then higher dose      beta blocker or the addition of a calcium channel blocker back      would be reasonable.  Consider electrophysiology involvement for      possible EP study or ablation in the future if this recurs, but for      now, with a one-time episode, if he remained in sinus rhythm, I      would continue him on a beta blocker and follow.  Echocardiogram is      reasonable to look for structural heart problems and to evaluate      his murmur.  2. Non-ST-elevation myocardial infarction.  This is likely demand in      the setting of his prolonged tachycardia, but he does have quite a      high elevation of his cardiac markers.  Will continue to cycle his      enzymes to trend his MB fraction.  Treat non-ST segment myocardial      infarction with aspirin, high-dose statin, addition of beta blocker      and anticoagulation with Lovenox.  At this time, he is symptom      free, and we will hold Plavix until catheter, given there is not an      unreasonable chance that he might have multivessel disease.  Will      check the cath schedule and possibly perform catheterization in the      morning, so please keep him n.p.o. for now with normal saline at 1      mL/kg per hour.   Thank you for this consult.      Adela Ports, MD  Electronically Signed     DWM/MEDQ  D:  08/11/2008  T:  08/11/2008  Job:  [id number]

## 2011-03-31 ENCOUNTER — Ambulatory Visit (INDEPENDENT_AMBULATORY_CARE_PROVIDER_SITE_OTHER): Payer: Managed Care, Other (non HMO) | Admitting: Internal Medicine

## 2011-03-31 ENCOUNTER — Encounter: Payer: Self-pay | Admitting: Internal Medicine

## 2011-03-31 VITALS — BP 134/84 | HR 60 | Ht 66.0 in | Wt 155.0 lb

## 2011-03-31 DIAGNOSIS — I471 Supraventricular tachycardia: Secondary | ICD-10-CM

## 2011-03-31 DIAGNOSIS — I498 Other specified cardiac arrhythmias: Secondary | ICD-10-CM

## 2011-03-31 NOTE — Progress Notes (Signed)
The patient presents today for routine electrophysiology followup.  Since last being seen in our clinic, the patient reports doing very well.  He has had no further SVT.  Today, he denies symptoms of palpitations, chest pain, shortness of breath, orthopnea, PND, lower extremity edema, dizziness, presyncope, syncope, or neurologic sequela.  The patient feels that he is tolerating medications without difficulties and is otherwise without complaint today.   Past Medical History  Diagnosis Date  . Anxiety   . CAD (coronary artery disease)     nonobstructive by cath 2009  . Hyperlipidemia   . Supraventricular tachycardia     one episode 2009 without recurrence  . Shingles 11/15/03  . Left ventricular hypertrophy     severe by echo 2009   Past Surgical History  Procedure Date  . Rotator cuff repair   . Nasal septum surgery   . Tendon repair     Current Outpatient Prescriptions  Medication Sig Dispense Refill  . aspirin 81 MG tablet Take 81 mg by mouth daily.        Marland Kitchen atorvastatin (LIPITOR) 20 MG tablet Take 1 tablet (20 mg total) by mouth daily.  100 tablet  3  . diazepam (VALIUM) 10 MG tablet Take 1 tablet (10 mg total) by mouth at bedtime.  30 tablet  3  . metoprolol succinate (TOPROL-XL) 25 MG 24 hr tablet Take 1 tablet (25 mg total) by mouth daily.  100 tablet  3  . DISCONTD: aspirin 325 MG tablet Take 325 mg by mouth daily.          No Known Allergies  History   Social History  . Marital Status: Married    Spouse Name: N/A    Number of Children: N/A  . Years of Education: N/A   Occupational History  . Not on file.   Social History Main Topics  . Smoking status: Never Smoker   . Smokeless tobacco: Not on file  . Alcohol Use: No  . Drug Use: No  . Sexually Active:    Other Topics Concern  . 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      Family History    Problem Relation Age of Onset  . Coronary artery disease Father   . Heart attack Father   . Heart disease Sister   . Heart disease Brother   . Coronary artery disease Brother   . Heart disease Brother    Physical Exam: Filed Vitals:   03/31/11 0944  BP: 134/84  Pulse: 60  Height: 5\' 6"  (1.676 m)  Weight: 155 lb (70.308 kg)    GEN- The patient is well appearing, alert and oriented x 3 today.   Head- normocephalic, atraumatic Eyes-  Sclera clear, conjunctiva pink Ears- hearing intact Oropharynx- clear Neck- supple, no JVP Lymph- no cervical lymphadenopathy Lungs- Clear to ausculation bilaterally, normal work of breathing Heart- Regular rate and rhythm, 1/6 SEM LUSB, rubs or gallops, PMI not laterally displaced GI- soft, NT, ND, + BS Extremities- no clubbing, cyanosis, or edema MS- no significant deformity or atrophy Skin- no rash or lesion Psych- euthymic mood, full affect Neuro- strength and sensation are intact  Recent lipids/ LFTs from 4/12 reveiwed Echo from 2009 reviewed  Assessment and Plan:

## 2011-03-31 NOTE — Assessment & Plan Note (Signed)
No recurrence Consider stopping toprol pending results of echo.

## 2011-03-31 NOTE — Assessment & Plan Note (Signed)
Nonobstructive  Continue ASA and lipitor  Echo today to evaluate LVH/ murmur

## 2011-03-31 NOTE — Patient Instructions (Signed)
Your physician recommends that you schedule a follow-up appointment in: YEAR WITH DR [facility]  Your physician has requested that you have an echocardiogram. Echocardiography is a painless test that uses sound waves to create images of your heart. It provides your doctor with information about the size and shape of your heart and how well your heart's chambers and valves are working. This procedure takes approximately one hour. There are no restrictions for this procedure. AT [facility] CONVENIENCE   Your physician recommends that you continue on your current medications as directed. Please refer to the Current Medication list given to you today.

## 2011-04-01 NOTE — Op Note (Signed)
[name], [name]                 ACCOUNT NO.:  [id number]   MEDICAL RECORD NO.:  [id number]          PATIENT TYPE:  AMB   LOCATION:  DSC                          FACILITY:  [facility]   PHYSICIAN:  Cindee Salt, M.D.       DATE OF BIRTH:  1951-07-21   DATE OF PROCEDURE:  03/31/2006  DATE OF DISCHARGE:                                 OPERATIVE REPORT   PREOPERATIVE DIAGNOSIS:  Ruptured biceps, left elbow.   POSTOPERATIVE DIAGNOSIS:  Ruptured biceps, left elbow.   OPERATION:  Reconstruction and repair with palmaris longus graft, biceps  tendon distally, left elbow.   SURGEON:  Cindee Salt, M.D.   ASSISTANT:  Dairl Ponder, M.D.   ANESTHESIA:  General.   HISTORY:  The patient is a 60 year old male who suffered a rupture of his  biceps tendon, removing luggage from an overhead rack of an airplane.  The  injury occurred 5 weeks ago.  MRI reveals a rupture of the biceps.  He is  admitted for repair/reconstruction using possible palmaris longus graft.  Questions were answered in the preoperative area, the area marked, the  patient identified, antibiotic given.   PROCEDURE:  The patient was brought to the operating room where a general  anesthetic was carried out without difficulty.  He was prepped using  DuraPrep, supine position, left arm free.  A sterile tourniquet was used and  placed on the upper arm.  The limb was exsanguinated with an Esmarch  bandage.  Tourniquet placed high on the arm was inflated to 250 mmHg.  A  hockey-stick-type incision was made, distally based radially, proximally  based ulnarly, and carried down through the subcutaneous tissue.  This was  carried transversely across the antecubital fossa, neurovascular structures  protected.  The dissection carried down to the ruptured biceps, which was  immediately apparent.  The interval between the flexor extensor mass was  opened.  Median nerve was identified, along with the brachial artery and  veins.  Bleeders were  tied with 2-0 Vicryl sutures.  The dissection carried  down to the biceps tuberosity.  The avulsion was made immediately apparent.  A bursa was present.  The biceps tendon was then isolated, Krawkow suture  placed, release performed to the biceps; this, however, was not able to be  fully brought down to the level of the old biceps tuberosity of the radius.  As such, it was decided to proceed with a palmar longus graft.  A transverse  incision was made over the palmaris longus at the palmar fascia.  This was  isolated, a retractor placed and second incision made proximally at the  musculotendinous junction and carried down through the subcutaneous tissue.  The tendon was then transected at the musculotendinous junction and  delivered distally.  The wounds were irrigated and closed.  The muscle was  dissected free from the musculotendinous junction.  The tendon was then  woven through the old biceps, sutured into position with figure-of-eight 4-0  Mersilene sutures.  A Krawkow suture, having been placed for traction with a  2-0 FiberWire, was then used  to suture the palmaris longus together into 1  tendon, again using a Krawkow suture.  The sutures were left through the  tendon distally.  The biceps tuberosity was then marked with image  intensification.  The guidewire for an [employer] 8 x 12-mm interference  screw was then placed.  This was then reamed.  The anchor was then prepared,  the loop placed around the palmar longus tendon, the traction placed onto  the tendon and the biointerference screw was then placed into the hole along  with the tendon, firmly affixing the palmar longus in the radial tuberosity  and biceps tuberosity of the radius.  This was done with approximately 10  degrees of flexion of the elbow, the arm placed through a full range motion.  The tendon was intact proximally and distally.  The wound was irrigated.  The subcutaneous tissue was then closed with interrupted 4-0  Vicryl and the  skin with interrupted 5-0 nylon sutures.  A sterile compressive dressing and  long arm splint applied.  The patient tolerated the procedure well and was  taken to the recovery room for observation in satisfactory condition.  It is  noted that any bone debris was removed with a pair of pickups or with  irrigation to minimize any heterotopic ossification.  The patient was taken  to the recovery room for observation in satisfactory condition.  He is  discharged home to return to the [facility] in 1 week on  Percocet.           ______________________________  Cindee Salt, M.D.     GK/MEDQ  D:  03/31/2006  T:  04/01/2006  Job:  [id number]   cc:   Luanna Cole. Lenord Fellers, M.D.  Fax: [phone]

## 2011-04-01 NOTE — Op Note (Signed)
[place]. [facility]  Patient:    [name], [name]                          MRN: [id number] Proc. Date: 05/11/00 Adm. Date:  [id number] Attending:  Aldean Baker V                           Operative Report  PREOPERATIVE DIAGNOSIS:  Right shoulder impingement syndrome with partial rotator cuff tear.  POSTOPERATIVE DIAGNOSES: 1. Right shoulder impingement syndrome type 3 acromion. 2. Grade 1 superior labrum anterior and posterior lesion. 3. Partial rotator cuff tear.  PROCEDURE: 1. Subacromial decompression. 2. Debridement of partial rotator cuff tear. 3. Debridement of superior labrum anterior and posterior lesion.  SURGEON:  Nadara Mustard, M.D.  ANESTHESIA:  General endotracheal plus interscalene block.  ESTIMATED BLOOD LOSS:  Minimal.  ANTIBIOTICS:  None.  DRAINS:  None.  COMPLICATIONS:  None.  DISPOSITION:  To PACU in stable condition.  Follow up in the office in two weeks.  HISTORY OF PRESENT ILLNESS:  Patient is a 60 year old gentleman with impingement symptoms in his right shoulder.  He has failed conservative care. He has undergone injections therapy, nonsteroidals.  MRI scan shows the type 3 acromion with partial rotator cuff tear.  Patient presents at this time for arthroscopic intervention.  Risks and benefits were discussed and patient wished to proceed at this time.  Risks of stiffness, infection, neurovascular injury.  DESCRIPTION OF PROCEDURE:  Patient was brought to outpatient room 6 and underwent a general anesthetic.  Patient also had a interscalene block placed prior to being brought back to the operating room.  After adequate level of anesthesia obtained, patient was placed in the beach chair position and his right upper extremity was prepped using Duraprep and draped into a sterile field.  A posterior incision was made and the scope was inserted through the posterior portal.  Visualization showed a grade 1 SLAP lesion, as  well as, a partial rotator cuff tear.  An anterior portal was made with outside/in technique and using the Vapor Mitek, the partial rotator cuff was debrided, as well as, the SLAP lesion.  The Vapor was not used to touch any articular cartilage.  After visualization of the joint, after debridement, there was some mild adhesions around the subscapularis tendon.  There was full range of motion of the joint.  Instruments removed, the scope was inserted through the posterior portal in the subacromial space and was inserted through the lateral portal with the Incisor plus, as well as, the acromionizer.  A subacromial decompression was performed.  Bursal tissue was excised.  There was no full-thickness tears through the superior aspect of the rotator cuff. Instruments removed.  The joint and subacromial space was injected with a total of 20 cc of 0.5% Marcaine plain.  Incisions were closed using #4 nylon. The wounds were covered with Adaptic, orthopedic sponges, ABD dressing, and Hypafix tape.  Patient was extubated and taken to PACU in stable condition. Placed in a sling.  Progressive range of motion starting tomorrow.  Discharged to home, follow-up in two weeks. DD:  05/11/00 TD:  05/12/00 Job: [id number]

## 2011-04-08 ENCOUNTER — Ambulatory Visit (HOSPITAL_COMMUNITY): Payer: Managed Care, Other (non HMO) | Attending: Family Medicine | Admitting: Radiology

## 2011-04-08 DIAGNOSIS — I2510 Atherosclerotic heart disease of native coronary artery without angina pectoris: Secondary | ICD-10-CM

## 2011-04-08 DIAGNOSIS — I079 Rheumatic tricuspid valve disease, unspecified: Secondary | ICD-10-CM | POA: Insufficient documentation

## 2011-04-08 DIAGNOSIS — I059 Rheumatic mitral valve disease, unspecified: Secondary | ICD-10-CM | POA: Insufficient documentation

## 2011-04-08 DIAGNOSIS — E785 Hyperlipidemia, unspecified: Secondary | ICD-10-CM | POA: Insufficient documentation

## 2011-04-08 DIAGNOSIS — I471 Supraventricular tachycardia: Secondary | ICD-10-CM

## 2011-08-15 LAB — CBC
HCT: 43.4
Hemoglobin: 14.4
Hemoglobin: 15.8
MCHC: 33.7
MCV: 92.1
MCV: 90.3
Platelets: 174
RBC: 4.66
RBC: 5.12
RDW: 13.8
RDW: 13.8
WBC: 13.1 — ABNORMAL HIGH
WBC: 20 — ABNORMAL HIGH

## 2011-08-15 LAB — CARDIAC PANEL(CRET KIN+CKTOT+MB+TROPI)
CK, MB: 23.4 — ABNORMAL HIGH
CK, MB: 38.7 — ABNORMAL HIGH
Relative Index: 6.3 — ABNORMAL HIGH
Relative Index: 7 — ABNORMAL HIGH
Total CK: 417 — ABNORMAL HIGH
Total CK: 508 — ABNORMAL HIGH
Troponin I: 1.74
Troponin I: 2.47

## 2011-08-15 LAB — LIPID PANEL
Cholesterol: 138
HDL: 43
LDL Cholesterol: 78
Triglycerides: 55
Triglycerides: 84
VLDL: 11

## 2011-08-15 LAB — COMPREHENSIVE METABOLIC PANEL
ALT: 21
CO2: 27
Calcium: 8.6
Creatinine, Ser: 1.12
GFR calc Af Amer: >60
GFR calc non Af Amer: >60
Glucose, Bld: 94
Sodium: 140
Total Protein: 6.1

## 2011-08-15 LAB — TSH
TSH: 0.715
TSH: 1.684

## 2011-08-15 LAB — DIFFERENTIAL
Basophils Relative: 1
Eosinophils Relative: 0
Lymphocytes Relative: 8 — ABNORMAL LOW
Neutro Abs: 17.6 — ABNORMAL HIGH
Neutrophils Relative %: 88 — ABNORMAL HIGH

## 2011-08-15 LAB — POCT CARDIAC MARKERS
CKMB, poc: 5.6
CKMB, poc: 7.3
Myoglobin, poc: 363
Myoglobin, poc: 431
Troponin i, poc: <0.05
Troponin i, poc: <0.05

## 2011-08-15 LAB — BASIC METABOLIC PANEL
Calcium: 9.7
Chloride: 105
Chloride: 104
Creatinine, Ser: 1.5
GFR calc Af Amer: >60
GFR calc Af Amer: 58 — ABNORMAL LOW
GFR calc non Af Amer: >60
Potassium: 4.1
Sodium: 135
Sodium: 141

## 2011-08-15 LAB — MAGNESIUM: Magnesium: 2.3

## 2011-08-15 LAB — APTT: aPTT: 35

## 2012-04-12 ENCOUNTER — Encounter: Payer: Self-pay | Admitting: Gastroenterology

## 2012-04-16 ENCOUNTER — Encounter: Payer: Self-pay | Admitting: Internal Medicine

## 2012-04-16 ENCOUNTER — Ambulatory Visit (INDEPENDENT_AMBULATORY_CARE_PROVIDER_SITE_OTHER): Payer: Managed Care, Other (non HMO) | Admitting: Internal Medicine

## 2012-04-16 VITALS — BP 128/74 | HR 55 | Ht 66.0 in | Wt 160.0 lb

## 2012-04-16 DIAGNOSIS — I4510 Unspecified right bundle-branch block: Secondary | ICD-10-CM

## 2012-04-16 DIAGNOSIS — I10 Essential (primary) hypertension: Secondary | ICD-10-CM

## 2012-04-16 DIAGNOSIS — E785 Hyperlipidemia, unspecified: Secondary | ICD-10-CM

## 2012-04-16 DIAGNOSIS — I517 Cardiomegaly: Secondary | ICD-10-CM

## 2012-04-16 DIAGNOSIS — I498 Other specified cardiac arrhythmias: Secondary | ICD-10-CM

## 2012-04-16 MED ORDER — ATORVASTATIN CALCIUM 20 MG PO TABS: 20.0000 mg | ORAL_TABLET | Freq: Every day | ORAL | Status: DC

## 2012-04-16 MED ORDER — METOPROLOL SUCCINATE ER 25 MG PO TB24: 25.0000 mg | ORAL_TABLET | Freq: Every day | ORAL | Status: DC

## 2012-04-16 NOTE — Progress Notes (Signed)
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  Since last being seen in our clinic, the patient reports doing very well.  Today, he denies symptoms of palpitations, chest pain, shortness of breath,  lower extremity edema, dizziness, presyncope, or syncope.  The patient is otherwise without complaint today.   Past Medical History  Diagnosis Date  . Anxiety   . CAD (coronary artery disease)     nonobstructive by cath 2009  . Hyperlipidemia     well controlled  . Supraventricular tachycardia     one episode 2009 without recurrence  . Shingles 11/15/03  . Left ventricular hypertrophy     severe by echo 2009, moderate by echo 4/12   Past Surgical History  Procedure Date  . Rotator cuff repair   . Nasal septum surgery   . Tendon repair     Current Outpatient Prescriptions  Medication Sig Dispense Refill  . aspirin 81 MG tablet Take 81 mg by mouth daily.        Marland Kitchen atorvastatin (LIPITOR) 20 MG tablet Take 1 tablet (20 mg total) by mouth daily.  100 tablet  3  . diazepam (VALIUM) 10 MG tablet Take 10 mg by mouth at bedtime as needed.      . metoprolol succinate (TOPROL-XL) 25 MG 24 hr tablet Take 1 tablet (25 mg total) by mouth daily.  100 tablet  3    Physical Exam: Filed Vitals:   04/16/12 0945  BP: 128/74  Pulse: 55  Height: 5\' 6"  (1.676 m)  Weight: 160 lb (72.576 kg)    GEN- The patient is well appearing, alert and oriented x 3 today.   Head- normocephalic, atraumatic Eyes-  Sclera clear, conjunctiva pink Ears- hearing intact Oropharynx- clear Lungs- Clear to ausculation bilaterally, normal work of breathing Heart- Regular rate and rhythm, no murmurs, rubs or gallops, PMI not laterally displaced GI- soft, NT, ND, + BS Extremities- no clubbing, cyanosis, or edema  ekg today reveals sinus rhythm 55 bpm, PR 164, QRS 122, RBBB, nonspecific St/T changes 4/12 echo reviewed  Assessment and Plan:

## 2012-04-16 NOTE — Assessment & Plan Note (Signed)
Previously well controlled (4/12 lipids reviewed) He has fasting lipids planned by Dr Lenord Fellers in the next few weeks

## 2012-04-16 NOTE — Assessment & Plan Note (Signed)
No recurrence No changes

## 2012-04-16 NOTE — Patient Instructions (Signed)
Your physician wants you to follow-up in: 12 months with Dr Allred You will receive a reminder letter in the mail two months in advance. If you don't receive a letter, please call our office to schedule the follow-up appointment.  

## 2012-04-19 ENCOUNTER — Other Ambulatory Visit: Payer: Self-pay | Admitting: Internal Medicine

## 2012-04-19 ENCOUNTER — Other Ambulatory Visit: Payer: BC Managed Care – HMO | Admitting: Internal Medicine

## 2012-04-19 DIAGNOSIS — N400 Enlarged prostate without lower urinary tract symptoms: Secondary | ICD-10-CM

## 2012-04-19 DIAGNOSIS — Z0000 Encounter for general adult medical examination without abnormal findings: Secondary | ICD-10-CM

## 2012-04-19 DIAGNOSIS — E785 Hyperlipidemia, unspecified: Secondary | ICD-10-CM

## 2012-04-19 LAB — CBC WITH DIFFERENTIAL/PLATELET
Basophils Absolute: 0.1 10*3/uL (ref 0.0–0.1)
Basophils Relative: 1 % (ref 0–1)
Eosinophils Absolute: 0.1 10*3/uL (ref 0.0–0.7)
MCH: 29.7 pg (ref 26.0–34.0)
MCHC: 33 g/dL (ref 30.0–36.0)
Monocytes Relative: 7 % (ref 3–12)
Neutrophils Relative %: 61 % (ref 43–77)
Platelets: 200 10*3/uL (ref 150–400)
RDW: 14 % (ref 11.5–15.5)

## 2012-04-19 LAB — LIPID PANEL
LDL Cholesterol: 81 mg/dL (ref 0–99)
Triglycerides: 42 mg/dL (ref <150)
VLDL: 8 mg/dL (ref 0–40)

## 2012-04-19 LAB — COMPREHENSIVE METABOLIC PANEL
Alkaline Phosphatase: 57 U/L (ref 39–117)
Glucose, Bld: 94 mg/dL (ref 70–99)
Sodium: 140 mEq/L (ref 135–145)
Total Bilirubin: 0.6 mg/dL (ref 0.3–1.2)
Total Protein: 6.5 g/dL (ref 6.0–8.3)

## 2012-04-20 ENCOUNTER — Ambulatory Visit (INDEPENDENT_AMBULATORY_CARE_PROVIDER_SITE_OTHER): Payer: BC Managed Care – HMO | Admitting: Internal Medicine

## 2012-04-20 ENCOUNTER — Encounter: Payer: Self-pay | Admitting: Internal Medicine

## 2012-04-20 VITALS — BP 124/80 | HR 68 | Temp 97.8°F | Ht 65.5 in | Wt 158.0 lb

## 2012-04-20 DIAGNOSIS — Z0000 Encounter for general adult medical examination without abnormal findings: Secondary | ICD-10-CM

## 2012-04-20 DIAGNOSIS — J309 Allergic rhinitis, unspecified: Secondary | ICD-10-CM

## 2012-04-20 DIAGNOSIS — G4700 Insomnia, unspecified: Secondary | ICD-10-CM

## 2012-04-20 DIAGNOSIS — E785 Hyperlipidemia, unspecified: Secondary | ICD-10-CM

## 2012-04-20 DIAGNOSIS — Z8679 Personal history of other diseases of the circulatory system: Secondary | ICD-10-CM

## 2012-04-20 DIAGNOSIS — I2510 Atherosclerotic heart disease of native coronary artery without angina pectoris: Secondary | ICD-10-CM

## 2012-04-20 NOTE — Patient Instructions (Addendum)
Continue same medications. Your cholesterol is under excellent control. Return in 6 months for fasting lipid panel liver functions and office visit.

## 2012-05-14 ENCOUNTER — Encounter: Payer: Self-pay | Admitting: Internal Medicine

## 2012-05-14 NOTE — Progress Notes (Signed)
  Subjective:    Patient ID: [name], male    DOB: 07/18/1951, 61 y.o.   MRN: [id number]  HPI patient is a 61 year old male with history of allergic rhinitis and hyperlipidemia. He was hospitalized in September 2009 with supraventricular tachycardia, had cardiac catheterization that showed a 40 percent stenosis in the mid LAD. He is intolerant of Plavix it causes aphthous ulcers. He is on lipid-lowering medication. Takes aspirin, metoprolol. Had allergy testing done 2003 demonstrating severe hypersensitivity to grasses weeds trees molds and house dust mite. Spiriva tree was done and FEV1 was 84% of predicted but improved with bronchodilatation to 90% of predicted. In 2007 he had a distal biceps rupture with 4 cm of retraction. He had reconstruction by Dr. Merlyn Lot. Right rotator cuff surgery 2001. Colonoscopy 2003. Herpes zoster 2005. Followed by cardiologist.   Social history: He is divorced. Wife was an alcoholic. 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 No children.  Family history: Father died of an MI. One brother with history of pacemaker. Another brother with coronary artery disease. One sister with pacemaker. One sister in good health.  Not been seen here since 2010.    Review of Systems  Constitutional: Negative.   HENT: Negative.   Eyes: Negative.   Respiratory: Negative.   Gastrointestinal: Negative.   Genitourinary: Negative.   Musculoskeletal: Negative.   Neurological: Negative.   Hematological: Negative.   Psychiatric/Behavioral:       Insomnia on trips       Objective:   Physical Exam  Vitals reviewed. Constitutional: He is oriented to person, place, and time. He appears well-developed and well-nourished. No distress.  HENT:  Head: Normocephalic and atraumatic.  Right Ear: External ear normal.  Left Ear: External ear normal.  Nose: Nose normal.  Mouth/Throat: Oropharynx is clear and moist. No oropharyngeal exudate.  Eyes:  Conjunctivae and EOM are normal. Pupils are equal, round, and reactive to light. Right eye exhibits no discharge. Left eye exhibits no discharge. No scleral icterus.  Neck: Neck supple. No JVD present. No thyromegaly present.  Cardiovascular: Normal rate, regular rhythm, normal heart sounds and intact distal pulses.   Pulmonary/Chest: Effort normal and breath sounds normal. No stridor. He has no wheezes. He has no rales.  Abdominal: Bowel sounds are normal. He exhibits no distension and no mass. There is no tenderness. There is no rebound and no guarding.  Genitourinary: Prostate normal.  Musculoskeletal: Normal range of motion. He exhibits no edema.  Lymphadenopathy:    He has no cervical adenopathy.  Neurological: He is alert and oriented to person, place, and time. He has normal reflexes. No cranial nerve deficit. Coordination normal.  Skin: Skin is warm and dry. No rash noted. He is not diaphoretic.  Psychiatric: He has a normal mood and affect. His behavior is normal. Judgment and thought content normal.          Assessment & Plan:  History of supraventricular tachycardia 2009-stable on medication  Coronary artery disease  Hyperlipidemia  Allergic rhinitis  History of biceps rupture status post reconstruction  Plan: Refill Valium 10 mg to take at bedtime when necessary sleep. Continue Lipitor. Return in 6 months for office visit, lipid panel, liver functions.

## 2012-06-28 ENCOUNTER — Ambulatory Visit (INDEPENDENT_AMBULATORY_CARE_PROVIDER_SITE_OTHER): Payer: BC Managed Care – HMO | Admitting: Nurse Practitioner

## 2012-06-28 ENCOUNTER — Encounter: Payer: Self-pay | Admitting: Nurse Practitioner

## 2012-06-28 ENCOUNTER — Telehealth: Payer: Self-pay | Admitting: *Deleted

## 2012-06-28 VITALS — BP 120/77 | HR 58 | Ht 67.0 in | Wt 156.1 lb

## 2012-06-28 DIAGNOSIS — I498 Other specified cardiac arrhythmias: Secondary | ICD-10-CM

## 2012-06-28 DIAGNOSIS — I471 Supraventricular tachycardia: Secondary | ICD-10-CM

## 2012-06-28 DIAGNOSIS — R002 Palpitations: Secondary | ICD-10-CM

## 2012-06-28 NOTE — Telephone Encounter (Signed)
Spoke with [name] and she stated the patient did not want to make any of his appointment until he has his appointment book, related the message to Ward Givens NP

## 2012-06-28 NOTE — Progress Notes (Signed)
Patient Name: Ennis Heavner Date of Encounter: 06/28/2012  Primary Care Provider:  Evette Georges, MD Primary Cardiologist:  J. Allred, MD  Patient Profile  61 y/o male with h/o SVT who presents with complaints of "sinking feeling in chest."  Problem List   Past Medical History  Diagnosis Date  . Anxiety   . CAD (coronary artery disease)     nonobstructive by cath 2009  . Hyperlipidemia     well controlled  . Supraventricular tachycardia     one episode 2009 without recurrence  . Shingles 11/15/03  . Left ventricular hypertrophy     severe by echo 2009, moderate by echo 4/12   Past Surgical History  Procedure Date  . Rotator cuff repair   . Nasal septum surgery   . Tendon repair     Allergies  No Known Allergies  HPI  61 y/o male with the above problem list.  Over the past few months, he has noted increasing frequency of a "sinking feeling" in his chest w/o associated Ss.  He thinks this may represent palpitations but cannot be sure.  Ss typically last a minute or two and do not feel like prior tachypalps assoc w/ SVT in the past.  He remains active and travels a lot for work.  He denies chest pain, pnd, orthopnea, n, v, dizziness, syncope, edema, weight gain, or early satiety.  Home Medications  Prior to Admission medications   Medication Sig Start Date End Date Taking? Authorizing Provider  aspirin 81 MG tablet Take 81 mg by mouth daily.     Yes Historical Provider, MD  atorvastatin (LIPITOR) 20 MG tablet Take 1 tablet (20 mg total) by mouth daily. 04/16/12  Yes Hillis Range, MD  diazepam (VALIUM) 10 MG tablet Take 10 mg by mouth at bedtime as needed. 03/14/11  Yes Roderick Pee, MD  metoprolol succinate (TOPROL-XL) 25 MG 24 hr tablet Take 1 tablet (25 mg total) by mouth daily. 04/16/12  Yes Hillis Range, MD    Review of Systems  Occas "sinking feeling' as above.  All other systems reviewed and are otherwise negative except as noted above.  Physical  Exam  Blood pressure 120/77, pulse 58, height 5\' 7"  (1.702 m), weight 156 lb 1.9 oz (70.816 kg).  General: Pleasant, NAD Psych: Normal affect. Neuro: Alert and oriented X 3. Moves all extremities spontaneously. HEENT: Normal  Neck: Supple without bruits or JVD. Lungs:  Resp regular and unlabored, CTA. Heart: RRR no s3, s4, or murmurs. Abdomen: Soft, non-tender, non-distended, BS + x 4.  Extremities: No clubbing, cyanosis or edema. DP/PT/Radials 2+ and equal bilaterally.  Accessory Clinical Findings  ECG - SB, 54, RBBB, no acute st/t changes.  Assessment & Plan  1.  Palpitations:  Described as a "sinking feeling" and different from previous tachypalpitations assoc with SVT.  No presyncope, c/p, sob associated with current Ss.  Will place 21 day event monitor and repeat 2d echo.  Pt had complete blood work in June and was having some palps back then, thus will not repeat blood work now.  He will call us when he is ready to start wearing the monitor as he travels quite a bit for work and doesn't want to commit to anything right now.   2.  H/O SVT:  No tachypalps.  He remains on bb therapy.  3.  Dispo:  Monitor and echo as above - pt will call us back to schedule.  F/U with Dr. Johney Frame after above testing.  Nicolasa Ducking, NP 06/28/2012, 2:11 PM

## 2012-06-28 NOTE — Patient Instructions (Addendum)
Your physician recommends that you continue on your current medications as directed. Please refer to the Current Medication list given to you today.  Your physician has recommended that you wear a 21 DAY event monitor. 21 DAY Event monitors are medical devices that record the heart's electrical activity. Doctors most often [place] these monitors to diagnose arrhythmias. Arrhythmias are problems with the speed or rhythm of the heartbeat. The monitor is a small, portable device. You can wear one while you do your normal daily activities. This is usually used to diagnose what is causing palpitations/syncope (passing out).   Your physician has requested that you have an echocardiogram. Echocardiography is a painless test that uses sound waves to create images of your heart. It provides your doctor with information about the size and shape of your heart and how well your heart's chambers and valves are working. This procedure takes approximately one hour. There are no restrictions for this procedure.   Your physician recommends that you schedule a follow-up appointment with Dr. Johney Frame 8 weeks

## 2013-02-01 ENCOUNTER — Ambulatory Visit (INDEPENDENT_AMBULATORY_CARE_PROVIDER_SITE_OTHER): Payer: BC Managed Care – HMO | Admitting: Internal Medicine

## 2013-02-01 ENCOUNTER — Encounter: Payer: Self-pay | Admitting: Internal Medicine

## 2013-02-01 VITALS — BP 116/74 | Temp 99.0°F | Wt 162.0 lb

## 2013-02-01 DIAGNOSIS — J209 Acute bronchitis, unspecified: Secondary | ICD-10-CM

## 2013-02-01 MED ORDER — LEVOFLOXACIN 500 MG PO TABS: 500.0000 mg | ORAL_TABLET | Freq: Every day | ORAL | Status: DC

## 2013-02-01 NOTE — Patient Instructions (Addendum)
Take Levaquin 500 milligrams daily for 7 days. If not better in 7 days have prescription refilled. Call if symptoms worsen or do not improve

## 2013-02-04 NOTE — Progress Notes (Signed)
  Subjective:    Patient ID: [name], male    DOB: 1951-09-25, 62 y.o.   MRN: [id number]  HPI Patient was traveling recently in [place] staying at [facility]. Apparently came down with acute respiratory infection while there. Had fever and chills with flulike illness. Continues to cough. History of allergic rhinitis.    Review of Systems     Objective:   Physical Exam HEENT exam: TMs are clear. Pharynx is clear. Neck is supple without thyromegaly or adenopathy. Chest clear to auscultation without rales or wheezing.        Assessment & Plan:  Viral syndrome  Bronchitis  History of allergic rhinitis  Plan: Levaquin 500 milligrams daily for 7 days with one refill. If not better in 7 days have prescription refilled.

## 2013-02-22 ENCOUNTER — Encounter: Payer: Self-pay | Admitting: Gastroenterology

## 2013-03-12 ENCOUNTER — Other Ambulatory Visit: Payer: Self-pay

## 2013-03-12 MED ORDER — DIAZEPAM 10 MG PO TABS: 10.0000 mg | ORAL_TABLET | Freq: Every evening | ORAL | Status: DC | PRN

## 2013-04-22 ENCOUNTER — Other Ambulatory Visit: Payer: Self-pay | Admitting: Internal Medicine

## 2013-04-22 NOTE — Telephone Encounter (Signed)
..   Requested Prescriptions   Pending Prescriptions Disp Refills  . metoprolol succinate (TOPROL-XL) 25 MG 24 hr tablet [Pharmacy Med Name: METOPROLOL ER 25MG   TAB] 90 tablet 0    Sig: TAKE ONE TABLET BY MOUTH EVERY DAY  . atorvastatin (LIPITOR) 20 MG tablet [Pharmacy Med Name: ATORVASTATIN 20MG    TAB] 90 tablet 0    Sig: TAKE ONE TABLET BY MOUTH EVERY DAY

## 2013-04-24 ENCOUNTER — Encounter: Payer: Self-pay | Admitting: Internal Medicine

## 2013-04-24 ENCOUNTER — Ambulatory Visit (INDEPENDENT_AMBULATORY_CARE_PROVIDER_SITE_OTHER): Payer: BC Managed Care – HMO | Admitting: Internal Medicine

## 2013-04-24 VITALS — BP 155/93 | HR 60 | Ht 67.0 in | Wt 162.0 lb

## 2013-04-24 DIAGNOSIS — I517 Cardiomegaly: Secondary | ICD-10-CM

## 2013-04-24 DIAGNOSIS — E785 Hyperlipidemia, unspecified: Secondary | ICD-10-CM

## 2013-04-24 DIAGNOSIS — I471 Supraventricular tachycardia: Secondary | ICD-10-CM

## 2013-04-24 DIAGNOSIS — I498 Other specified cardiac arrhythmias: Secondary | ICD-10-CM

## 2013-04-24 MED ORDER — METOPROLOL SUCCINATE ER 25 MG PO TB24: 25.0000 mg | ORAL_TABLET | Freq: Every day | ORAL | Status: DC

## 2013-04-24 MED ORDER — ATORVASTATIN CALCIUM 20 MG PO TABS: 20.0000 mg | ORAL_TABLET | Freq: Every day | ORAL | Status: DC

## 2013-04-24 NOTE — Patient Instructions (Addendum)
Your physician wants you to follow-up in: 12 months with Dr Allred You will receive a reminder letter in the mail two months in advance. If you don't receive a letter, please call our office to schedule the follow-up appointment.   Your physician has requested that you have an echocardiogram. Echocardiography is a painless test that uses sound waves to create images of your heart. It provides your doctor with information about the size and shape of your heart and how well your heart's chambers and valves are working. This procedure takes approximately one hour. There are no restrictions for this procedure.     

## 2013-05-06 ENCOUNTER — Ambulatory Visit (HOSPITAL_COMMUNITY): Payer: BC Managed Care – HMO | Attending: Cardiovascular Disease | Admitting: Radiology

## 2013-05-06 DIAGNOSIS — E785 Hyperlipidemia, unspecified: Secondary | ICD-10-CM | POA: Insufficient documentation

## 2013-05-06 DIAGNOSIS — I2510 Atherosclerotic heart disease of native coronary artery without angina pectoris: Secondary | ICD-10-CM | POA: Insufficient documentation

## 2013-05-06 DIAGNOSIS — F411 Generalized anxiety disorder: Secondary | ICD-10-CM | POA: Insufficient documentation

## 2013-05-06 DIAGNOSIS — I471 Supraventricular tachycardia, unspecified: Secondary | ICD-10-CM | POA: Insufficient documentation

## 2013-05-06 DIAGNOSIS — R002 Palpitations: Secondary | ICD-10-CM | POA: Insufficient documentation

## 2013-05-06 DIAGNOSIS — I4510 Unspecified right bundle-branch block: Secondary | ICD-10-CM | POA: Insufficient documentation

## 2013-05-06 NOTE — Progress Notes (Signed)
Echocardiogram performed.  

## 2013-05-08 ENCOUNTER — Encounter: Payer: Self-pay | Admitting: Internal Medicine

## 2013-05-08 NOTE — Progress Notes (Signed)
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  Since last being seen in our clinic, the patient reports doing very well. He remains active.  He reports that recently, he has had several episodes of palpitations with an uneasiness in his chest.  These have improved over the past few weeks.  He remains active.  Today, he denies symptoms of exertional chest pain, shortness of breath,  lower extremity edema, dizziness, presyncope, or syncope.  The patient is otherwise without complaint today.   Past Medical History  Diagnosis Date  . Anxiety   . CAD (coronary artery disease)     nonobstructive by cath 2009  . Hyperlipidemia     well controlled  . Supraventricular tachycardia     one episode 2009 without recurrence  . Shingles 11/15/03  . Left ventricular hypertrophy     severe by echo 2009, moderate by echo 4/12   Past Surgical History  Procedure Laterality Date  . Rotator cuff repair    . Nasal septum surgery    . Tendon repair      Current Outpatient Prescriptions  Medication Sig Dispense Refill  . aspirin 81 MG tablet Take 81 mg by mouth daily.        Marland Kitchen atorvastatin (LIPITOR) 20 MG tablet Take 1 tablet (20 mg total) by mouth daily.  90 tablet  3  . diazepam (VALIUM) 10 MG tablet Take 1 tablet (10 mg total) by mouth at bedtime as needed.  30 tablet  0  . levofloxacin (LEVAQUIN) 500 MG tablet Take 1 tablet (500 mg total) by mouth daily.  7 tablet  1  . metoprolol succinate (TOPROL-XL) 25 MG 24 hr tablet Take 1 tablet (25 mg total) by mouth daily.  90 tablet  3   No current facility-administered medications for this visit.    Physical Exam: Filed Vitals:   04/24/13 1625  BP: 155/93  Pulse: 60  Height: 5\' 7"  (1.702 m)  Weight: 162 lb (73.483 kg)    GEN- The patient is well appearing, alert and oriented x 3 today.   Head- normocephalic, atraumatic Eyes-  Sclera clear, conjunctiva pink Ears- hearing intact Oropharynx-  clear Lungs- Clear to ausculation bilaterally, normal work of breathing Heart- Regular rate and rhythm, no murmurs, rubs or gallops, PMI not laterally displaced GI- soft, NT, ND, + BS Extremities- no clubbing, cyanosis, or edema  ekg today reveals sinus rhythm 60 bpm, PR 160, QRS 122, RBBB, nonspecific St/T changes 4/12 echo reviewed  Assessment and Plan:  1. SVT- he has done reasonably well No changes  2. LVH/ hypertension Repeat echo Lifestyle modification  3. HL Stable No change required today

## 2013-06-20 ENCOUNTER — Telehealth: Payer: Self-pay | Admitting: Internal Medicine

## 2013-06-20 NOTE — Telephone Encounter (Signed)
Received a signed [facility] requesting a copy of the patient's recent echo Faxed echo from 6.23.14 to [phone]/djc

## 2013-07-05 ENCOUNTER — Encounter: Payer: Self-pay | Admitting: Gastroenterology

## 2013-09-02 ENCOUNTER — Ambulatory Visit (AMBULATORY_SURGERY_CENTER): Payer: BC Managed Care – HMO

## 2013-09-02 VITALS — Ht 66.0 in | Wt 155.0 lb

## 2013-09-02 DIAGNOSIS — Z1211 Encounter for screening for malignant neoplasm of colon: Secondary | ICD-10-CM

## 2013-09-02 MED ORDER — MOVIPREP 100 G PO SOLR: 1.0000 | Freq: Once | ORAL | Status: DC

## 2013-09-04 ENCOUNTER — Encounter: Payer: Self-pay | Admitting: Gastroenterology

## 2013-09-12 ENCOUNTER — Encounter: Payer: BC Managed Care – HMO | Admitting: Gastroenterology

## 2013-09-18 ENCOUNTER — Encounter: Payer: BC Managed Care – HMO | Admitting: Gastroenterology

## 2013-09-30 ENCOUNTER — Encounter: Payer: BC Managed Care – HMO | Admitting: Gastroenterology

## 2013-10-15 ENCOUNTER — Encounter: Payer: BC Managed Care – HMO | Admitting: Gastroenterology

## 2014-03-24 ENCOUNTER — Other Ambulatory Visit: Payer: Self-pay | Admitting: Internal Medicine

## 2014-03-24 NOTE — Telephone Encounter (Signed)
Refill once -uses for travel. Please call and remind him of need for CPE. Last CPE was 2013 and last OV 2014.

## 2014-06-03 ENCOUNTER — Other Ambulatory Visit: Payer: Self-pay | Admitting: *Deleted

## 2014-06-03 DIAGNOSIS — I471 Supraventricular tachycardia: Secondary | ICD-10-CM

## 2014-06-03 DIAGNOSIS — E785 Hyperlipidemia, unspecified: Secondary | ICD-10-CM

## 2014-06-03 MED ORDER — ATORVASTATIN CALCIUM 20 MG PO TABS: 20.0000 mg | ORAL_TABLET | Freq: Every day | ORAL | Status: DC

## 2014-06-03 MED ORDER — METOPROLOL SUCCINATE ER 25 MG PO TB24: 25.0000 mg | ORAL_TABLET | Freq: Every day | ORAL | Status: DC

## 2014-06-03 NOTE — Telephone Encounter (Signed)
Pt called in for refills and made yearly OV with provider

## 2014-06-27 ENCOUNTER — Other Ambulatory Visit: Payer: 59 | Admitting: Internal Medicine

## 2014-06-27 DIAGNOSIS — Z79899 Other long term (current) drug therapy: Secondary | ICD-10-CM

## 2014-06-27 DIAGNOSIS — Z0000 Encounter for general adult medical examination without abnormal findings: Secondary | ICD-10-CM

## 2014-06-27 DIAGNOSIS — Z125 Encounter for screening for malignant neoplasm of prostate: Secondary | ICD-10-CM

## 2014-06-27 DIAGNOSIS — E785 Hyperlipidemia, unspecified: Secondary | ICD-10-CM

## 2014-06-27 LAB — COMPREHENSIVE METABOLIC PANEL
ALBUMIN: 4.3 g/dL (ref 3.5–5.2)
ALT: 14 U/L (ref 0–53)
AST: 19 U/L (ref 0–37)
Alkaline Phosphatase: 52 U/L (ref 39–117)
BUN: 18 mg/dL (ref 6–23)
CALCIUM: 8.7 mg/dL (ref 8.4–10.5)
CHLORIDE: 102 meq/L (ref 96–112)
CO2: 28 meq/L (ref 19–32)
CREATININE: 1.03 mg/dL (ref 0.50–1.35)
GLUCOSE: 87 mg/dL (ref 70–99)
POTASSIUM: 5 meq/L (ref 3.5–5.3)
Sodium: 139 mEq/L (ref 135–145)
Total Bilirubin: 0.6 mg/dL (ref 0.2–1.2)
Total Protein: 6.6 g/dL (ref 6.0–8.3)

## 2014-06-27 LAB — CBC WITH DIFFERENTIAL/PLATELET
Basophils Absolute: 0.1 10*3/uL (ref 0.0–0.1)
Basophils Relative: 1 % (ref 0–1)
EOS PCT: 2 % (ref 0–5)
Eosinophils Absolute: 0.1 10*3/uL (ref 0.0–0.7)
HEMATOCRIT: 42.3 % (ref 39.0–52.0)
HEMOGLOBIN: 14.5 g/dL (ref 13.0–17.0)
LYMPHS PCT: 32 % (ref 12–46)
Lymphs Abs: 1.8 10*3/uL (ref 0.7–4.0)
MCH: 30.1 pg (ref 26.0–34.0)
MCHC: 34.3 g/dL (ref 30.0–36.0)
MCV: 87.9 fL (ref 78.0–100.0)
MONO ABS: 0.3 10*3/uL (ref 0.1–1.0)
MONOS PCT: 6 % (ref 3–12)
NEUTROS ABS: 3.4 10*3/uL (ref 1.7–7.7)
Neutrophils Relative %: 59 % (ref 43–77)
Platelets: 181 10*3/uL (ref 150–400)
RBC: 4.81 MIL/uL (ref 4.22–5.81)
RDW: 14.2 % (ref 11.5–15.5)
WBC: 5.7 10*3/uL (ref 4.0–10.5)

## 2014-06-27 LAB — LIPID PANEL
CHOL/HDL RATIO: 2.9 ratio
CHOLESTEROL: 143 mg/dL (ref 0–200)
HDL: 50 mg/dL (ref >39)
LDL Cholesterol: 80 mg/dL (ref 0–99)
TRIGLYCERIDES: 66 mg/dL (ref <150)
VLDL: 13 mg/dL (ref 0–40)

## 2014-06-28 LAB — PSA: PSA: 1.87 ng/mL (ref <=4.00)

## 2014-06-30 ENCOUNTER — Encounter: Payer: Self-pay | Admitting: Internal Medicine

## 2014-06-30 ENCOUNTER — Ambulatory Visit (INDEPENDENT_AMBULATORY_CARE_PROVIDER_SITE_OTHER): Payer: 59 | Admitting: Internal Medicine

## 2014-06-30 VITALS — BP 118/78 | HR 72 | Temp 99.3°F | Ht 65.75 in | Wt 160.5 lb

## 2014-06-30 DIAGNOSIS — Z8679 Personal history of other diseases of the circulatory system: Secondary | ICD-10-CM

## 2014-06-30 DIAGNOSIS — Z23 Encounter for immunization: Secondary | ICD-10-CM

## 2014-06-30 DIAGNOSIS — J309 Allergic rhinitis, unspecified: Secondary | ICD-10-CM

## 2014-06-30 DIAGNOSIS — R351 Nocturia: Secondary | ICD-10-CM | POA: Insufficient documentation

## 2014-06-30 DIAGNOSIS — E785 Hyperlipidemia, unspecified: Secondary | ICD-10-CM

## 2014-06-30 DIAGNOSIS — Z0000 Encounter for general adult medical examination without abnormal findings: Secondary | ICD-10-CM

## 2014-06-30 LAB — POCT URINALYSIS DIPSTICK
BILIRUBIN UA: NEGATIVE
GLUCOSE UA: NEGATIVE
Ketones, UA: NEGATIVE
LEUKOCYTES UA: NEGATIVE
NITRITE UA: NEGATIVE
Protein, UA: NEGATIVE
RBC UA: NEGATIVE
Spec Grav, UA: 1.015
Urobilinogen, UA: NEGATIVE
pH, UA: 6.5

## 2014-06-30 MED ORDER — TAMSULOSIN HCL 0.4 MG PO CAPS: 0.4000 mg | ORAL_CAPSULE | Freq: Every day | ORAL | Status: DC

## 2014-06-30 MED ORDER — PNEUMOCOCCAL 13-VAL CONJ VACC IM SUSP: 0.5000 mL | INTRAMUSCULAR | Status: AC

## 2014-06-30 NOTE — Patient Instructions (Signed)
Try Flomax 0.4 mg daily for nocturia. Return in one year or as needed. Please reschedule colonoscopy.

## 2014-08-10 NOTE — Progress Notes (Signed)
Subjective:    Patient ID: [name], male    DOB: 1951/10/31, 63 y.o.   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 He feels well with no complaints. Has been traveling amount business. He has a history of allergic rhinitis and hyperlipidemia. He was hospitalized in September 2009 with supraventricular tachycardia. He had a cardiac catheterization that showed a 40% stenosis in the mid LAD which was thought to be a nonobstructive lesion. However troponins were positive. He had associated tachycardia with this. This was thought to be a nonobstructive lesion. However his troponins were elevated but he had associated tachycardia He is intolerant of Plavix as it causes aphthous ulcers. He is on lipid-lowering medication. He takes aspirin and metoprolol.  He had allergy testing done in 2003 demonstrating severe hypersensitivity to grasses, weeds, trees, molds and house dust mite. Spirometry showed FEV1 was 84% of predicted but improved with bronchodilatation to 90% of predicted.  In 2007 he had a distal biceps rupture with 4 cm of retraction. He had reconstruction by Dr. Merlyn Lot. Right rotator cuff surgery 2001. Colonoscopy 2003. Needs to have repeat study. Herpes zoster 2005. Followed by cardiologist. History of shingles November 2005 treated with Neurontin and Medrol. Afterwards developed tingling in hands and some tingling in his feet. 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  Social history: He is divorced. Wife was an alcoholic. 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 No children.  Family history: Father died of an MI. One brother with history of pacemaker. Another brother with coronary artery disease. One sister with pacemaker. One sister in good health.    Review of Systems  Constitutional: Negative.   Cardiovascular:       History of coronary artery disease and  supraventricular tachycardia 2009  Genitourinary:       Nocturia  Allergic/Immunologic: Positive for environmental allergies.  All other systems reviewed and are negative.      Objective:   Physical Exam  Vitals reviewed. Constitutional: He is oriented to person, place, and time. He appears well-developed and well-nourished. No distress.  HENT:  Head: Normocephalic and atraumatic.  Right Ear: External ear normal.  Left Ear: External ear normal.  Mouth/Throat: Oropharynx is clear and moist. No oropharyngeal exudate.  Eyes: Conjunctivae and EOM are normal. Pupils are equal, round, and reactive to light. Right eye exhibits no discharge. Left eye exhibits no discharge. No scleral icterus.  Neck: Neck supple. No JVD present. No thyromegaly present.  Cardiovascular: Normal rate, regular rhythm, normal heart sounds and intact distal pulses.   No murmur heard. Pulmonary/Chest: Effort normal and breath sounds normal. No respiratory distress. He has no wheezes. He has no rales.  Abdominal: Soft. Bowel sounds are normal. He exhibits no distension and no mass. There is no rebound and no guarding.  Genitourinary: Prostate normal.  Musculoskeletal: Normal range of motion. He exhibits no edema.  Lymphadenopathy:    He has no cervical adenopathy.  Neurological: He is alert and oriented to person, place, and time. He has normal reflexes. No cranial nerve deficit.  Skin: Skin is warm and dry. No rash noted. He is not diaphoretic.  Psychiatric: He has a normal mood and affect. His behavior is normal. Judgment and thought content normal.          Assessment & Plan:  Coronary artery disease  Hyperlipidemia  History of supraventricular tachycardia 2009-stable on medication  Allergic  rhinitis  History biceps rupture status post reconstruction  New complaint is nocturia-try Flomax 0.4 mg daily.  Plan: Continue same medications and return in one year or as needed. Prevnar given today.  Reschedule colonoscopy.

## 2014-08-11 ENCOUNTER — Encounter: Payer: Self-pay | Admitting: Internal Medicine

## 2014-08-11 ENCOUNTER — Ambulatory Visit (INDEPENDENT_AMBULATORY_CARE_PROVIDER_SITE_OTHER): Payer: PRIVATE HEALTH INSURANCE | Admitting: Internal Medicine

## 2014-08-11 VITALS — BP 142/80 | HR 67 | Ht 66.5 in | Wt 161.0 lb

## 2014-08-11 DIAGNOSIS — I471 Supraventricular tachycardia: Secondary | ICD-10-CM

## 2014-08-11 DIAGNOSIS — I498 Other specified cardiac arrhythmias: Secondary | ICD-10-CM

## 2014-08-11 DIAGNOSIS — I2510 Atherosclerotic heart disease of native coronary artery without angina pectoris: Secondary | ICD-10-CM

## 2014-08-11 DIAGNOSIS — E785 Hyperlipidemia, unspecified: Secondary | ICD-10-CM

## 2014-08-11 DIAGNOSIS — I517 Cardiomegaly: Secondary | ICD-10-CM

## 2014-08-11 MED ORDER — ATORVASTATIN CALCIUM 20 MG PO TABS: 20.0000 mg | ORAL_TABLET | Freq: Every day | ORAL | Status: DC

## 2014-08-11 MED ORDER — METOPROLOL SUCCINATE ER 25 MG PO TB24: 12.5000 mg | ORAL_TABLET | Freq: Every day | ORAL | Status: AC

## 2014-08-11 NOTE — Progress Notes (Signed)
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  Since last being seen in our clinic, the patient reports doing very well. He remains active.  He exercises 3-5 days per week.  He gets tired at times during exercise.  He denies any chest discomfort, arrhythmia, or palpitations. Today, he denies symptoms of shortness of breath,  lower extremity edema, dizziness, presyncope, or syncope. + urinary frequency-> recently started on flomax. The patient is otherwise without complaint today.   Past Medical History  Diagnosis Date  . Anxiety   . CAD (coronary artery disease)     nonobstructive by cath 2009  . Hyperlipidemia     well controlled  . Supraventricular tachycardia     one episode 2009 without documented recurrence  . Shingles 11/15/03  . Left ventricular hypertrophy     severe by echo 2009, moderate by echo 4/12   Past Surgical History  Procedure Laterality Date  . Rotator cuff repair    . Nasal septum surgery    . Tendon repair      Current Outpatient Prescriptions  Medication Sig Dispense Refill  . aspirin 81 MG tablet Take 81 mg by mouth daily.        Marland Kitchen atorvastatin (LIPITOR) 20 MG tablet Take 1 tablet (20 mg total) by mouth daily.  90 tablet  0  . diazepam (VALIUM) 10 MG tablet TAKE ONE TABLET BY MOUTH AT BEDTIME AS NEEDED FOR SLEEP      . metoprolol succinate (TOPROL-XL) 25 MG 24 hr tablet Take 1 tablet (25 mg total) by mouth daily.  90 tablet  0  . tamsulosin (FLOMAX) 0.4 MG CAPS capsule Take 1 capsule (0.4 mg total) by mouth daily.  90 capsule  1   Current Facility-Administered Medications  Medication Dose Route Frequency Provider Last Rate Last Dose  . pneumococcal 13-valent conjugate vaccine (PREVNAR 13) injection 0.5 mL  0.5 mL Intramuscular Tomorrow-1000 Margaree Mackintosh, MD        Physical Exam: Filed Vitals:   08/11/14 1637  BP: 142/80  Pulse: 67  Height: 5' 6.5" (1.689 m)  Weight: 161 lb (73.029 kg)     GEN- The patient is well appearing, alert and oriented x 3 today.   Head- normocephalic, atraumatic Eyes-  Sclera clear, conjunctiva pink Ears- hearing intact Oropharynx- clear Lungs- Clear to ausculation bilaterally, normal work of breathing Heart- Regular rate and rhythm, no murmurs, rubs or gallops, PMI not laterally displaced GI- soft, NT, ND, + BS Extremities- no clubbing, cyanosis, or edema  ekg today reveals sinus rhythm 67 bpm, PR 166, QRS 122, RBBB, nonspecific St/T changes 4/14 echo reviewed  Assessment and Plan:  1. SVT- he has done reasonably well Will reduce metoprolol XL to 12.5mg  daily and follow  2. LVH/ hypertension Stable No change required today  3. HL Stable No change required today  4. Nonobstructive CAD No ischemic symptoms Continue ASA, statin  Return in 1 year He will contact my office should problems arise in the interim.

## 2014-08-11 NOTE — Patient Instructions (Signed)
Your physician has recommended you make the following change in your medication:  1.) decrease metoprolol to 12.5 mg daily.  Your physician wants you to follow-up in: 1 year with Dr. Johney Frame.  You will receive a reminder letter in the mail two months in advance. If you don't receive a letter, please call our office to schedule the follow-up appointment.

## 2014-10-20 ENCOUNTER — Telehealth: Payer: Self-pay

## 2014-10-20 NOTE — Telephone Encounter (Signed)
Spoke with patient regarding flu vaccine.  He says he received it from work October 2015.

## 2014-11-11 ENCOUNTER — Other Ambulatory Visit: Payer: Self-pay | Admitting: *Deleted

## 2014-11-11 MED ORDER — DIAZEPAM 10 MG PO TABS: 10.0000 mg | ORAL_TABLET | Freq: Every evening | ORAL | Status: DC | PRN

## 2014-11-11 NOTE — Telephone Encounter (Signed)
Patient called saying he never got refill on Valium 10 mg. We do not have documentation that [facility] Call in Valium 10 mg #30 with one refill one by mouth daily as needed. He uses this when he travels. [facility] [address]

## 2015-07-29 ENCOUNTER — Telehealth: Payer: Self-pay | Admitting: *Deleted

## 2015-07-29 NOTE — Telephone Encounter (Signed)
Yes we can refill x 3 months all of his meds. 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

## 2015-07-29 NOTE — Telephone Encounter (Signed)
Left message for patient to call back with pharmacy information.

## 2015-07-29 NOTE — Telephone Encounter (Signed)
Patient called he states he recently moved to [place] and has not established a Dr there. He wants to know if we will refill his medications . He has not been seen here since August of 2015. Do you want [place] to fill his meds. He states he will call back tomorrow with pharmacy information if we will fill them.

## 2015-07-30 ENCOUNTER — Other Ambulatory Visit: Payer: Self-pay | Admitting: Internal Medicine

## 2015-07-30 MED ORDER — DIAZEPAM 10 MG PO TABS: 10.0000 mg | ORAL_TABLET | Freq: Every evening | ORAL | Status: DC | PRN

## 2015-08-05 ENCOUNTER — Other Ambulatory Visit: Payer: Self-pay | Admitting: *Deleted

## 2015-08-05 DIAGNOSIS — E785 Hyperlipidemia, unspecified: Secondary | ICD-10-CM

## 2015-08-05 MED ORDER — ATORVASTATIN CALCIUM 20 MG PO TABS: 20.0000 mg | ORAL_TABLET | Freq: Every day | ORAL | Status: AC

## 2015-08-05 MED ORDER — TAMSULOSIN HCL 0.4 MG PO CAPS: 0.4000 mg | ORAL_CAPSULE | Freq: Every day | ORAL | Status: AC

## 2015-08-05 MED ORDER — DIAZEPAM 10 MG PO TABS: 10.0000 mg | ORAL_TABLET | Freq: Every evening | ORAL | Status: AC | PRN
